# Patient Record
Sex: Female | Born: 1940 | Race: White | Hispanic: No | Smoking: Former smoker
Health system: Southern US, Community
[De-identification: ages and names within clinical notes are randomized; demographics above are authoritative.]

## PROBLEM LIST (undated history)

## (undated) DIAGNOSIS — M549 Dorsalgia, unspecified: Secondary | ICD-10-CM

## (undated) DIAGNOSIS — G8929 Other chronic pain: Secondary | ICD-10-CM

## (undated) DIAGNOSIS — R200 Anesthesia of skin: Secondary | ICD-10-CM

## (undated) DIAGNOSIS — F329 Major depressive disorder, single episode, unspecified: Secondary | ICD-10-CM

## (undated) DIAGNOSIS — I82409 Acute embolism and thrombosis of unspecified deep veins of unspecified lower extremity: Secondary | ICD-10-CM

## (undated) DIAGNOSIS — F32A Depression, unspecified: Secondary | ICD-10-CM

## (undated) DIAGNOSIS — B9681 Helicobacter pylori [H. pylori] as the cause of diseases classified elsewhere: Secondary | ICD-10-CM

## (undated) DIAGNOSIS — K2970 Gastritis, unspecified, without bleeding: Secondary | ICD-10-CM

## (undated) DIAGNOSIS — F419 Anxiety disorder, unspecified: Secondary | ICD-10-CM

## (undated) DIAGNOSIS — K219 Gastro-esophageal reflux disease without esophagitis: Secondary | ICD-10-CM

## (undated) DIAGNOSIS — K2270 Barrett's esophagus without dysplasia: Secondary | ICD-10-CM

## (undated) HISTORY — DX: Gastritis, unspecified, without bleeding: K29.70

## (undated) HISTORY — DX: Helicobacter pylori (H. pylori) as the cause of diseases classified elsewhere: B96.81

## (undated) HISTORY — DX: Dorsalgia, unspecified: M54.9

## (undated) HISTORY — DX: Depression, unspecified: F32.A

## (undated) HISTORY — DX: Acute embolism and thrombosis of unspecified deep veins of unspecified lower extremity: I82.409

## (undated) HISTORY — DX: Other chronic pain: G89.29

## (undated) HISTORY — DX: Barrett's esophagus without dysplasia: K22.70

## (undated) HISTORY — DX: Anxiety disorder, unspecified: F41.9

## (undated) HISTORY — DX: Anesthesia of skin: R20.0

## (undated) HISTORY — DX: Major depressive disorder, single episode, unspecified: F32.9

## (undated) HISTORY — PX: VAGINAL HYSTERECTOMY: SUR661

## (undated) HISTORY — DX: Gastro-esophageal reflux disease without esophagitis: K21.9

---

## 1975-01-17 HISTORY — PX: APPENDECTOMY: SHX54

## 1979-01-17 HISTORY — PX: ABDOMINAL HYSTERECTOMY: SHX81

## 1985-01-16 HISTORY — PX: CHOLECYSTECTOMY: SHX55

## 1995-01-17 HISTORY — PX: BACK SURGERY: SHX140

## 1997-11-24 HISTORY — PX: COLONOSCOPY: SHX174

## 2001-11-25 ENCOUNTER — Ambulatory Visit (HOSPITAL_COMMUNITY): Admission: RE | Admit: 2001-11-25 | Discharge: 2001-11-25 | Payer: Self-pay | Admitting: Internal Medicine

## 2001-11-25 HISTORY — PX: COLONOSCOPY: SHX174

## 2004-04-06 ENCOUNTER — Inpatient Hospital Stay (HOSPITAL_COMMUNITY): Admission: AD | Admit: 2004-04-06 | Discharge: 2004-04-08 | Payer: Self-pay | Admitting: Cardiology

## 2004-04-06 ENCOUNTER — Ambulatory Visit: Payer: Self-pay | Admitting: Cardiology

## 2004-04-22 ENCOUNTER — Ambulatory Visit: Payer: Self-pay | Admitting: Cardiology

## 2006-01-16 DIAGNOSIS — B9681 Helicobacter pylori [H. pylori] as the cause of diseases classified elsewhere: Secondary | ICD-10-CM

## 2006-01-16 HISTORY — DX: Helicobacter pylori (H. pylori) as the cause of diseases classified elsewhere: B96.81

## 2006-06-07 ENCOUNTER — Ambulatory Visit: Payer: Self-pay | Admitting: Internal Medicine

## 2006-06-21 ENCOUNTER — Encounter: Payer: Self-pay | Admitting: Internal Medicine

## 2006-06-21 ENCOUNTER — Ambulatory Visit (HOSPITAL_COMMUNITY): Admission: RE | Admit: 2006-06-21 | Discharge: 2006-06-21 | Payer: Self-pay | Admitting: Internal Medicine

## 2006-06-21 ENCOUNTER — Ambulatory Visit: Payer: Self-pay | Admitting: Internal Medicine

## 2006-06-21 HISTORY — PX: ESOPHAGOGASTRODUODENOSCOPY: SHX1529

## 2006-06-21 HISTORY — PX: COLONOSCOPY: SHX174

## 2006-06-26 ENCOUNTER — Ambulatory Visit (HOSPITAL_COMMUNITY): Admission: RE | Admit: 2006-06-26 | Discharge: 2006-06-26 | Payer: Self-pay | Admitting: Internal Medicine

## 2006-08-27 ENCOUNTER — Ambulatory Visit: Payer: Self-pay | Admitting: Urgent Care

## 2007-03-13 ENCOUNTER — Ambulatory Visit: Payer: Self-pay | Admitting: Internal Medicine

## 2007-07-08 ENCOUNTER — Ambulatory Visit: Payer: Self-pay | Admitting: Internal Medicine

## 2007-07-08 ENCOUNTER — Encounter: Payer: Self-pay | Admitting: Internal Medicine

## 2007-07-08 ENCOUNTER — Ambulatory Visit (HOSPITAL_COMMUNITY): Admission: RE | Admit: 2007-07-08 | Discharge: 2007-07-08 | Payer: Self-pay | Admitting: Internal Medicine

## 2007-07-08 HISTORY — PX: ESOPHAGOGASTRODUODENOSCOPY: SHX1529

## 2007-12-09 ENCOUNTER — Encounter: Payer: Self-pay | Admitting: Internal Medicine

## 2007-12-09 DIAGNOSIS — J438 Other emphysema: Secondary | ICD-10-CM | POA: Insufficient documentation

## 2007-12-09 DIAGNOSIS — Z8719 Personal history of other diseases of the digestive system: Secondary | ICD-10-CM

## 2009-06-01 ENCOUNTER — Telehealth (INDEPENDENT_AMBULATORY_CARE_PROVIDER_SITE_OTHER): Payer: Self-pay

## 2009-06-22 ENCOUNTER — Ambulatory Visit: Payer: Self-pay | Admitting: Internal Medicine

## 2009-06-22 DIAGNOSIS — R1319 Other dysphagia: Secondary | ICD-10-CM

## 2009-06-22 DIAGNOSIS — M549 Dorsalgia, unspecified: Secondary | ICD-10-CM | POA: Insufficient documentation

## 2009-06-22 DIAGNOSIS — R109 Unspecified abdominal pain: Secondary | ICD-10-CM | POA: Insufficient documentation

## 2009-07-28 ENCOUNTER — Ambulatory Visit: Payer: Self-pay | Admitting: Internal Medicine

## 2009-07-28 ENCOUNTER — Ambulatory Visit (HOSPITAL_COMMUNITY): Admission: RE | Admit: 2009-07-28 | Discharge: 2009-07-28 | Payer: Self-pay | Admitting: Internal Medicine

## 2009-07-28 HISTORY — PX: ESOPHAGOGASTRODUODENOSCOPY: SHX1529

## 2009-08-01 ENCOUNTER — Encounter: Payer: Self-pay | Admitting: Internal Medicine

## 2009-10-12 ENCOUNTER — Ambulatory Visit: Payer: Self-pay | Admitting: Cardiology

## 2010-02-15 NOTE — Letter (Signed)
Summary: EGD/ED ORDER  EGD/ED ORDER   Imported By: Ave Filter 06/22/2009 16:45:56  _____________________________________________________________________  External Attachment:    Type:   Image     Comment:   External Document

## 2010-02-15 NOTE — Letter (Signed)
Summary: Patient Notice, Endo Biopsy Results  [facility]  [address]   [place], [place] [address]   Phone: [phone]  Fax: [phone]       August 01, 2009   [facility] [address] [place], [place]  [address] 57/84/6962    Dear Ms. Werts,  I am pleased to inform you that the biopsies taken during your recent endoscopic examination did not show any evidence of cancer upon pathologic examination.  Additional information/recommendation   Continue with the treatment plan as outlined on the day of your exam  Please call us if you are having persistent problems or have questions about your condition that have not been fully answered at this time.  Sincerely,    R. Roetta Sessions MD, FACP [facility] Ph: [phone]   Fax: [phone]   Appended Document: Patient Notice, Endo Biopsy Results letter mailed to pt

## 2010-02-15 NOTE — Progress Notes (Signed)
----   Converted from flag ---- ---- 06/01/2009 10:09 AM, Diana Eves wrote: pt aware of appt on 6/7 @1130  w/KJ  ---- 05/27/2009 1:14 PM, Cloria Spring LPN wrote: Pt wanted to schedule appt for TCS but is having some intermittent abdominal pain. She says she is not having any nausea or vomiting and is not having any problems with BM's. I told her she would need appt first. ------------------------------

## 2010-02-15 NOTE — Assessment & Plan Note (Signed)
Summary: consult for tcs,abd pain/ss   Primary Care Provider:  Hasanaj  Chief Complaint:  abd pain/consult for TCS/dysphagia.  History of Present Illness: 70 y/o caucasian female w/ worsening mid-abd pain x 3 weeks. Hx chronic abd pain and chronic back pain, describes as "sharp," doubles her over, lasts 15 mins - half-day.  No aggrevating factors, not assoc w/ eating. Eases if she self-induces vomiting.  Done this 3-4 times in past 3 weeks as this is only thing that helps.  Denies fever/chills.  Denies NSAIDs.  Takes percocet for back pain 3x/week.  Takes omeprazole daily.  Wt small gains.  Denies OTC stomach meds.  BM daily w/ 2-3 loose watery stools/ week.  Denies rectal bleeding or melena.  c/o pill dysphagia.  Denies any problems w/ liquids.  Last EGD 07/08/2007 NORMAL bx to r/o changes suspicious for Barrett's.  Hx chronically dialted CDB 12mm with normal LFTs.    Current Problems (verified): 1)  Other Dysphagia  (ICD-787.29) 2)  Abdominal Pain, Chronic  (ICD-789.00) 3)  Back Pain, Chronic  (ICD-724.5) 4)  Barrett's Esophagus, Hx of  (ICD-V12.79) 5)  Helicobacter Pylori Gastritis, Hx of  (ICD-V12.79) 6)  Emphysema  (ICD-492.8)  Current Medications (verified): 1)  Alprazolam 1 Mg Tabs (Alprazolam) .... Take 1 Tab By Mouth At Bedtime 2)  Percocet 5-325 Mg Tabs (Oxycodone-Acetaminophen) .... One To Two Tablets Each Day As Needed 3)  Combivent 103-18 Mcg/act Aero (Ipratropium-Albuterol) .... As Needed 4)  Omeprazole 20 Mg Cpdr (Omeprazole) .... Take 1 Tablet By Mouth Once A Day  Allergies (verified): 1)  ! Lucy Antigua  Past History:  Past Medical History: Last EGD 07/08/2007 NORMAL bx, previous hx SS Barrett's Chronically dialted CDB 12mm with normal LFTs s/p cholecystectomy  Hx TA and mild colitis->flex sigmoidoscopy 1999 chronic lung disease w/ hx tobacco abuse chronic back pain chronic functional abd pain Last Colonoscopy unable to intubate cecum, otherwise normal, followed by normal  ACBE  Past Surgical History: Back Surgery 1999 Cholecystectomy 1987 Hysterectomy 1981 Appendectomy 1977  Family History: Mother- leukemia Father-chronic lung disease No known family history of colorectal carcinoma, IBD, liver or chronic GI problems.  Social History: single, retired nonsmoker Alcohol Use - no Illicit Drug Use - no Drug Use:  no  Review of Systems General:  Complains of fatigue; denies fever, chills, sweats, anorexia, weakness, malaise, weight loss, and sleep disorder. CV:  Denies chest pains, angina, palpitations, syncope, dyspnea on exertion, orthopnea, PND, peripheral edema, and claudication. Resp:  Denies dyspnea at rest, dyspnea with exercise, cough, sputum, wheezing, coughing up blood, and pleurisy. GI:  See HPI; Denies jaundice. GU:  Denies urinary burning, blood in urine, nocturnal urination, urinary frequency, and urinary incontinence. Derm:  Denies rash, itching, dry skin, hives, moles, warts, and unhealing ulcers; bruising easily. Psych:  Denies depression, anxiety, memory loss, suicidal ideation, hallucinations, paranoia, phobia, and confusion. Heme:  Complains of bruising; denies bleeding and enlarged lymph nodes.  Vital Signs:  Patient profile:   70 year old female Height:      72 inches Weight:      190 pounds BMI:     25.86 Temp:     98.2 degrees F oral Pulse rate:   72 / minute BP sitting:   140 / 70  (left arm) Cuff size:   regular  Vitals Entered By: Cloria Spring LPN (June 23, 5282 11:30 AM)  Physical Exam  General:  Well developed, well nourished, no acute distress. Head:  Normocephalic and atraumatic. Eyes:  Sclera clear, no icterus. Ears:  Normal auditory acuity. Nose:  No deformity, discharge,  or lesions. Mouth:  No deformity or lesions, dentition normal. Neck:  Supple; no masses or thyromegaly. Lungs:  Clear throughout to auscultation. Heart:  Regular rate and rhythm; no murmurs, rubs,  or bruits. Abdomen:  Soft, nontender  and nondistended. No masses, hepatosplenomegaly or hernias noted. Normal bowel sounds.without guarding and without rebound.   Msk:  Symmetrical with no gross deformities. Normal posture. Pulses:  Normal pulses noted. Extremities:  No clubbing, cyanosis, edema or deformities noted. Neurologic:  Alert and  oriented x4;  grossly normal neurologically. Skin:  Intact without significant lesions or rashes. Cervical Nodes:  No significant cervical adenopathy. Psych:  Alert and cooperative. Normal mood and affect.  Impression & Recommendations:  Problem # 1:  OTHER DYSPHAGIA (ICD-787.29) 70 y/o caucasian female w/ hx chronic functional abd pain w/ intermittant occ loose stools/IBS, hx tubular adenoma, hx h pylori s/p treatment & Barrett's esophagus.  Now presents w/ pill dysphagia.  She will need further evaluation to r/o esophageal web, ring or stricture.  She is concerned she will need repeat colonscopy given her chronic abd pain.  I discussed this w/ Dr Jena Gauss who performed her last colonscopy & BE.  He recommends 5 yr interval for surveillance.  EGD with possible esophageal dilatation to be performed by Dr. Jonathon Bellows in the near future.  I have discussed risks and benefits which include, but are not limited to, bleeding, infection, perforation, or medication reaction.  The patient agrees with this plan and consent will be obtained.  Problem # 2:  BARRETT'S ESOPHAGUS, HX OF (ICD-V12.79) See #1  Problem # 3:  ABDOMINAL PAIN, CHRONIC (ICD-789.00) Long-standing ? adhesions, functional abd pain, IBS.  Patient Instructions: 1)  Begin Align daily 2)  Trial Levsin 0.125mg  two times a day as needed abd pain 3)  Continue omeprazole  4)  Colonoscopy 06/2011 Prescriptions: ALIGN  CAPS (PROBIOTIC PRODUCT) 1 by mouth daily  #5 x 0   Entered and Authorized by:   Joselyn Arrow FNP-BC   Signed by:   Joselyn Arrow FNP-BC on 06/22/2009   Method used:   Samples Given   RxID:    [id number] LEVSIN 0.125 MG TABS (HYOSCYAMINE SULFATE) 1 by mouth two times a day as needed for abd pain  #60 x 1   Entered and Authorized by:   Joselyn Arrow FNP-BC   Signed by:   Joselyn Arrow FNP-BC on 06/22/2009   Method used:   Electronically to        [facility]* (retail)       [address]       [place], [place]  [address]       Ph: [phone]       Fax: [phone]   RxID:   [id number]   Appended Document: Orders Update    Clinical Lists Changes  Orders: Added new Service order of Est. Patient Level IV (64403) - Signed      Appended Document: consult for tcs,abd pain/ss REMINDER IN COMPUTER

## 2010-05-31 NOTE — Op Note (Signed)
Jessica Gibbs, Jessica Gibbs                ACCOUNT NO.:  [id number]   MEDICAL RECORD NO.:  [id number]          PATIENT TYPE:  AMB   LOCATION:  DAY                           FACILITY:  [facility]   PHYSICIAN:  R. Roetta Sessions, M.D. DATE OF BIRTH:  06-15-1940   DATE OF PROCEDURE:  06/21/2006  DATE OF DISCHARGE:                               OPERATIVE REPORT   PROCEDURE PERFORMED:  Esophagogastroduodenoscopy with biopsy followed by  colonoscopy.   INDICATIONS FOR PROCEDURE:  70 year old lady with nonspecific abdominal  pain, history of colonic polyps, overdue for surveillance.  EGD and  colonoscopy now being done through our office.  CBC looked good.  Eosinophil count was slightly elevated at 11%.  LFTs were normal.  Amylase 52, lipase 34.  She has not been having any diarrhea or  hematochezia.  This approach has been discussed with the patient at  length.  The potential risks, benefits, and alternatives have been  reviewed, questions answered, and she is agreeable.  Please see  documentation in the medical record.   PROCEDURE NOTE:  O2 saturation, blood pressure, and pulse rate were  monitored throughout the entirety of both procedures.  Conscious  sedation with Versed 9 mg IV and Demerol 175 mg IV in divided doses.  Cetacaine spray for topical pharyngeal anesthesia.  Instrument Pentax  video chip system.   ESOPHAGOGASTRODUODENOSCOPY FINDINGS:  Examination of the tubular  esophagus revealed a 3 cm tongue of salmon colored epithelium coming up  from the EG junction.  The remainder of the esophageal mucosa appeared  normal.  The EG junction was easily traversed into the stomach.  The  gastric cavity was empty and insufflated well with air.  Thorough  examination of the gastric mucosa including a retroflex view of the  proximal stomach and esophagogastric junction demonstrated only a hiatal  hernia and diffusely reticulate pattern to the gastric mucosa.  No  infiltrating process, erosion, or  ulceration was seen.  The pylorus was  patent and easily traversed.  Examination of the bulb and second portion  revealed no abnormalities.   THERAPY/DIAGNOSTIC MANEUVERS PERFORMED:  Gastric biopsies were taken  and, also, the salmon colored epithelium in the distal esophagus was  also biopsied separately.  The patient tolerated the procedure well and  was prepared for colonoscopy.   COLONOSCOPY FINDINGS:  Digital rectal examination revealed no  abnormalities.  The endoscopic prep was marginal.  The colonic mucosa  was surveyed from the rectosigmoid colon through the left, transverse,  right colon, to the area of the ileocecal valve.  I initially was unable  to advance the adult scope beyond the area of the transverse colon.  The  left colon was somewhat fixed and noncompliant, likely secondary to  adhesions from prior hysterectomy.  I withdrew the adult scope and using  the pediatric colonoscope, was able to make it to the distal side of the  ileocecal valve but, in spite of exhausting all maneuvers including  changes in the patient's position and external abdominal pressure, I was  unable to intubate the cecum.  From this level, the scope was  slowly  withdrawn.  All previously mentioned mucosal surfaces were again seen.  The colon, otherwise, appeared normal.  The scope was pulled down in the  rectum.  A thorough examination of the rectal mucosa including  retroflexion of the anal verge demonstrated normal rectal mucosa.   The patient tolerated both procedures well, was reacted in endoscopy.   IMPRESSION:  Esophagogastroduodenoscopy:  3 cm tongue of salmon colored  epithelium distal esophagus, biopsied.  Small hiatal hernia.  Reticulating appearing gastric mucosa, biopsied.  Patent pylorus.  Normal D1 and D2.  Colonoscopy findings:  Normal rectum.  Normal appearing colon to the  ileocecal valve.  Cecum not intubated.   RECOMMENDATIONS:  1. Contrast barium enema to image the cecum  not seen today.  Repeat      CBC with differential, abdominal ultrasound, further evaluate      abdominal pain, follow up on path.  2. Further recommendations to follow.      Jonathon Bellows, M.D.  Electronically Signed     RMR/MEDQ  D:  06/21/2006  T:  06/21/2006  Job:  [id number]

## 2010-05-31 NOTE — H&P (Signed)
Jessica Gibbs, Jessica Gibbs                ACCOUNT NO.:  [id number]   MEDICAL RECORD NO.:  [id number]          PATIENT TYPE:  AMB   LOCATION:                                FACILITY:  [facility]   PHYSICIAN:  R. Roetta Sessions, M.D. DATE OF BIRTH:  July 20, 1940   DATE OF ADMISSION:  DATE OF DISCHARGE:                              HISTORY & PHYSICAL   PRIMARY CARE PHYSICIAN:  Dr. __________   CHIEF COMPLAINT:  Abdominal pain.   HISTORY OF PRESENT ILLNESS:  Ms. Jessica Gibbs is a very pleasant 70-year-  old lady with a few-month history of periumbilical abdominal pain.  She  states it is worse at night and sometimes doubles her over.  She has  one bowel movement daily to every other day.  No melena.  No rectal  bleeding.  Bowel function does not alter the pain.  She wakes up with it  and goes to bed with it.  Sometimes it is worse, as stated, at night.  She does have some typical reflux symptoms for which she takes Prevacid  30 mg orally daily.  She does not take nonsteroidals.  There has been no  odynophagia, no dysphagia, no early satiety, no nausea or vomiting, no  weight loss.  She does not consume alcohol or use tobacco products.  Her  gallbladder is out (1987).  She had similar symptoms in 1999, for which  Dr. Linna Darner __________  did an EGD and apparently found nothing  significant.  She has a history of colonic adenomas.  Her last  colonoscopy was in 2003.  She had a good report at that time, done by  me.  She is due for surveillance now.  She has not had any imaging  studies, blood work, Catering manager., recently.   PAST MEDICAL HISTORY:  1. Significant for chronic lung disease.  2. She is a former smoker.  3. Chronic back pain.  4. Status post cholecystectomy in 1987, back surgery in 1999,      hysterectomy in 1981, appendectomy in 1977.   CURRENT MEDICATIONS:  1. Alprazolam 1 mg q.h.s.  2. Percocet 5/325 1-2 daily.  3. Combivent inhaler p.r.n.  4. Prevacid 30 mg daily p.r.n.   ALLERGIES:  No  known drug allergies.   FAMILY HISTORY:  Mother had leukemia.  Father had chronic lung disease.  No history of chronic GI or liver illness.   SOCIAL HISTORY:  Patient is single.  She is unemployed.  No tobacco.  No  alcohol.  No illicit drugs.   REVIEW OF SYSTEMS:  No jaundice, fever, chills, clay-colored stools,  dark-colored urine.  No change in weight.   PHYSICAL EXAMINATION:  GENERAL:  Pleasant 70 year old lady resting  comfortably.  VITAL SIGNS:  Weight 188, height 6 feet.  Temp 98.4, blood pressure  138/70, pulse 64.  SKIN:  Warm and dry.  HEENT:  No scleral icterus.  Conjunctivae are pink.  Oral cavity with no  lesions.  CHEST:  Lungs are clear to auscultation.  CARDIAC:  Regular rate and rhythm without murmur, gallop or rub.  BREASTS:  Exam is  deferred.  ABDOMEN:  Nondistended.  Positive bowel sounds.  Soft.  Entirely  nontender.  Without appreciate mass, organomegaly.  EXTREMITIES:  No edema.  RECTAL:  Deferred to the colonoscopy.   Ms. Jessica Gibbs is a pleasant 70 year old lady with recent abdominal  pain as described above.  Symptoms somewhat nonspecific.  I suppose she  could have peptic ulcer disease.  Clinically she has an entirely benign  exam today.  Other possibilities such as pancreatitis, occult biliary  disease would remain in the differential.  Her symptoms are not tied to  bowel function.  She has a history of colonic adenoma and is due for a  surveillance exam now.   RECOMMENDATIONS:  Will proceed with an EGD and a colonoscopy in the near  future.  Potential risks, benefits and alternatives have been reviewed.  Questions are answered.  She is agreeable.  Will go ahead and check  amylase and lipase, chem-20 and CBC today as well.  Will make further  recommendations in the very near future.      Jonathon Bellows, M.D.  Electronically Signed     RMR/MEDQ  D:  06/07/2006  T:  06/07/2006  Job:  [id number]

## 2010-05-31 NOTE — Op Note (Signed)
[place], Jessica Gibbs                ACCOUNT NO.:  [id number]   MEDICAL RECORD NO.:  [id number]          PATIENT TYPE:  AMB   LOCATION:  DAY                           FACILITY:  [facility]   PHYSICIAN:  R. Roetta Sessions, M.D. DATE OF BIRTH:  30-Dec-1940   DATE OF PROCEDURE:  DATE OF DISCHARGE:                               OPERATIVE REPORT   PROCEDURE PERFORMED:  Esophagogastroduodenoscopy with biopsy.   INDICATIONS FOR PROCEDURE:  A 70 year old lady with longstanding  gastroesophageal reflux disease, esophagogastroduodenoscopy last year  demonstrated 3-cm tongue of salmon-colored epithelium, highly suspicious  for Barrett's, but biopsies were not consistent with Barrett's, and  certainly no dysplasia or evidence of malignancy.  She was treated for  H. pylori gastritis last year.  She has done well.  She is really having  no GI symptoms on Prevacid 30 mg orally daily.  She is brought back now  to reassess her EG junction, to determine whether or not she has  Barrett's esophagus.  This approach has been discussed with the patient  at length.  Potential risks, benefits, and alternatives have been  reviewed and questions answered.  She is agreeable.  Please see  documentation in the medical record.   PROCEDURE NOTE:  O2 saturation, blood pressure, pulse, and respirations  monitored throughout the entire procedure.   CONSCIOUS SEDATION:  Versed 4 mg IV, Demerol 125 mg IV in divided doses.  Cetacaine spray for topical pharyngeal anesthesia.   INSTRUMENT:  Pentax video chip system.   FINDINGS:  Examination of the tubular esophagus revealed a 3-cm tongue  of salmon-colored epithelium coming out from the EG junction.  Please  see photos.  Otherwise, esophageal mucosa appeared unremarkable.  The EG  junction was easily traversed.   Stomach:  Gastric cavity was emptied and insufflated well with air.  Thorough examination of the gastric mucosa including retroflexed view of  the proximal stomach  esophagogastric junction demonstrated some  reticulated pattern to the mucosa as seen 1 year ago, and there were  some tiny submucosal petechia.  There were some infiltrating process.  No ulcer.  EG junction was easily traversed.  Examination of the bulb  and second portion revealed no abnormalities.   THERAPEUTIC/DIAGNOSTIC MANEUVERS PERFORMED:  1. A 3-cm tongue of salmon-colored epithelium was biopsied for      histologic study.  2. Biopsies of the gastric mucosa were taken to rule out eradication      of H. pylori, etc.  The patient tolerated the procedure well and      was reacted to endoscopy.   IMPRESSION:  A 3 cm tongue of salmon-colored epithelium, suspicious for  Barrett's status post biopsy, otherwise unremarkable esophageus.  Reticulated pattern of the gastric mucosa in a patchy distribution with  tiny submucosal petechia, of uncertain significance, status post biopsy.  Patent pylorus, normal D1 and D2.   RECOMMENDATIONS:  1. Continue Prevacid 30 mg daily.  2. Follow up on biopsies.  3. Further recommendations to follow up.      Jonathon Bellows, M.D.  Electronically Signed     RMR/MEDQ  D:  07/08/2007  T:  07/08/2007  Job:  [id number]

## 2010-05-31 NOTE — Assessment & Plan Note (Signed)
NAMEBURNETT, LIEBER                 CHART#:  [id number]   DATE:  03/13/2007                       DOB:  August 17, 1940   FOLLOWUP:  H. Pyloric gastritis, short segment Barrett's esophagus (by  screening).   LAST SEEN:  08/27/2006.   She had taken Prevpac.  She has done well.  She developed some what  sounds like self limiting thrush with the antibiotics previously.  She  had an incomplete colonoscopy, unable to see the cecum last year, but it  was complemented with an air contrast barium enema, which demonstrated  no cecal abnormalities.   She is status post cholecystectomy, CBD chronically dilated at 12 mm.  LFTs were normal.  Overall she has done very well.  Reflux symptoms well  controlled on Prevacid 30 mg once daily.  Overall she feels well from a  GI standpoint.  She continues to be followed by Dr. Olena Leatherwood primarily.   CURRENT MEDICATIONS:  See updated list.   ALLERGIES:  No known drug allergies.   EXAMINATION:  Today she looks well.  Weight 190.  Height 6 feet.  Temperature 98.  Blood pressure 112/70, pulse 60.  CHEST:  Lungs are clear to auscultation.  HEART:  Regular rate and rhythm without murmur, gallop, rub.  ABDOMEN:  Nondistended.  Positive bowel sounds.  Soft, nontender without  appreciable mass or hepatosplenomegaly.   ASSESSMENT:  Negative examination of her colon last year by TCS and ACD,  even with a history of colonic polyps.  Recommend she come back in five  years for followup colonoscopy.  As far as short segment Barrett's,  although it was stated in the prior record, a three year followup EGD,  she really ought to go ahead and have one, 1 year out, and therefore we  will set  her up for an EGD in June of this year.  She is to continue Prevacid 30  mg early daily indefinitely.       Jonathon Bellows, M.D.  Electronically Signed     RMR/MEDQ  D:  03/13/2007  T:  03/13/2007  Job:  [id number]   cc:   Lia Hopping

## 2010-05-31 NOTE — Assessment & Plan Note (Signed)
NAMELOZA, PRELL                 CHART#:  [id number]   DATE:  08/27/2006                       DOB:  1940/05/30   CHIEF COMPLAINT:  Follow-up colonoscopy and EGD.   SUBJECTIVE:  Jessica Gibbs is a 70 year old female who underwent EGD and  colonoscopy by Dr. Jena Gauss on 06/21/2006.  She has a history of  nonspecific abdominal pain as well as colonic polyps.  She also had  eosinophilia on peripheral blood work.  Her LFTs were normal and she had  a normal amylase and lipase.  On EGD she was found to have a 3-cm tongue  of salmon-colored epithelium.  The biopsy is consistent with Barrett's  esophagus.  She had a small hiatal hernia and otherwise normal exam.  Her colonoscopy was normal; however, Dr. Jena Gauss was unable to intubate  the cecum.  This was followed by a contrast barium enema which was  normal.  Gastric biopsies revealed chronic gastritis with focal  intestinal metaplasia and H. pylori.  She tells me she has not been  treated as of this point.  Her ultrasound also showed a dilated biliary  tree with central intrahepatic biliary radicals and a CBD of 12 mm.  LFTs were normal.  She has been feeling well since her procedure.  She  does complain of some fatigue.  Last week she was seen by Dr. Olena Leatherwood,  treated for pharyngitis with antibiotic for 5 days.  She denies any  abdominal pain at this time.  Denies any fever, chills, nausea,  vomiting, heartburn, indigestion.  She has about 3 loose bowel movements  a day.  Denies any rectal bleeding, melena or mucus in her stools.   CURRENT MEDICATIONS:  See the list from 08/27/2006.   ALLERGIES:  No drug allergies.   OBJECTIVE:  VITAL SIGNS:  Weight 187.5 pounds, height 5 feet 2 inches,  temperature 98.1, blood pressure 128/70, pulse 60.  GENERAL:  Ms. Jessica Gibbs is a well-developed, well-nourished Caucasian female  in no acute distress.  HEENT:  Equal, sclerae clear, nonicteric.  Conjunctivae pink, oropharynx  pink and moist without any  lesions.  CHEST:  Heart regular rate and rhythm.  Normal S1, S2.  ABDOMEN:  Positive bowel sounds x4.  No bruits auscultated.  Soft,  nontender, nondistended.  No palpable masses or hepatosplenomegaly.  No  rebound tenderness or guarding.  EXTREMITIES:  Without clubbing or edema bilaterally.  SKIN:  Pink, warm and dry without any rashes or jaundice.   ASSESSMENT:  Ms. Jessica Gibbs is a 70 year old female with vague upper  abdominal pain which is now resolved on Prevacid 30 mg in the morning.  She was found to have Barrett's esophagus.  She also has a hiatal hernia  and was found to have H. pylori gastritis.  Personal history of colonic  adenomas.   PLAN:  1. We will begin Prevpac as soon as she completes antibiotic for      pharyngitis.  2. I have asked her to have buttermilk or yogurt with each meal.  3. Colonoscopy in 5 years given history of adenomatous polyp.  4. EGD in 3 years to follow up on Barrett's.  5. Office visit in 6 months with Dr. Jena Gauss.       Jessica Gibbs, N.P.  Electronically Signed     Kassie Mends, M.D.  Electronically Signed  KJ/MEDQ  D:  08/27/2006  T:  08/28/2006  Job:  [id number]   cc:   Lia Hopping

## 2010-06-03 NOTE — Op Note (Signed)
NAMESTELLAROSE, Jessica Gibbs                            ACCOUNT NO.:  [id number]   MEDICAL RECORD NO.:  [id number]                   PATIENT TYPE:  AMB   LOCATION:  DAY                                  FACILITY:  [facility]   PHYSICIAN:  R. Roetta Sessions, M.D.              DATE OF BIRTH:  October 10, 1940   DATE OF PROCEDURE:  11/25/2001  DATE OF DISCHARGE:                                 OPERATIVE REPORT   INDICATIONS FOR PROCEDURE:  The patient is a 70 year old lady devoid of any  lower GI tract symptoms, had an adenomatous polyp removed from her colon in  1999.  It was from the left colon.  She continues to be devoid of any lower  GI tract symptoms.  No abdominal pain, diarrhea, constipation, or blood per  rectum.  She is here for surveillance.  She has not had any interim health  problems.  She is now undergoing surveillance colonoscopy ASA2.   PROCEDURE NOTE:  Her O2 saturation, blood pressure, pulse oximetry were  monitored throughout the entire procedure.  Conscious sedation of Versed 5  mg IV, Demerol 100 mg IV in divided doses.  The instrument was an Olympus  Video _____ adult colonoscope.   FINDINGS:  Digital rectal exam revealed no abnormalities.  Endoscopic  findings showed the prep was adequate.  Rectal and colon examination:  Rectal mucosa in the retroflexed view virtually revealed only internal  hemorrhoids.   Colonic mucosa was surveyed from the rectosigmoid junction through the left  transverse, right colon, to the appendiceal orifice, ileocecal valve, to the  cecum.  These sections were well seen and photographed for the record.   Colonic mucosa appeared normal aside from there was some subjective  increased hyperemia diffusely of the left colon, and at least subjectively a  slight narrowing of the lumen through the sigmoid segment, but this was  minimal, and there were no erosions or ulcerations.  Vascular pattern was  preserved.  No evidence of recurrent polyp or neoplasm.  The  cecum and  ileocecal valve were well seen and photographed for the record.  From this  level the scope was slowly and cautiously withdrawn.  All previously  mentioned mucosal surfaces were again seen.  No other abnormalities were  observed.  The patient tolerated the procedure well and was brought to  recovery.   IMPRESSION:  1. Internal hemorrhoids, otherwise normal rectum.  2. Normal-appearing colonic mucosa except subjectively some increased     hyperemia and minimal narrowing of the sigmoid colon compared to the more     proximal colon, but this was minimal and of doubt for clinical     significance.  The patient has no bowel symptoms.   RECOMMENDATIONS:  1. Yearly Hemoccults per family physician, Dr. ________.  2. Repeat surveillance colonoscopy in five years.  Should the patient     develop any interim GI problems, she is  to let me know.                                               Jonathon Bellows, M.D.   RMR/MEDQ  D:  11/25/2001  T:  11/25/2001  Job:  [id number]

## 2010-06-03 NOTE — Cardiovascular Report (Signed)
NAMEWALTA, BELLVILLE                ACCOUNT NO.:  [id number]   MEDICAL RECORD NO.:  [id number]          PATIENT TYPE:  INP   LOCATION:  4731                         FACILITY:  [facility]   PHYSICIAN:  Arturo Morton. Riley Kill, M.D. [facility] OF BIRTH:  1940/12/20   DATE OF PROCEDURE:  04/07/2004  DATE OF DISCHARGE:                              CARDIAC CATHETERIZATION   INDICATIONS:  Ms. Schubach is a 70 year old woman who presents with some  atypical chest pain with negative enzymes. She has COPD as well as a history  of hiatal hernia and has bifemoral bruits suggestive of peripheral vascular  disease. Current study was done to assess coronary anatomy.   PROCEDURE:  1.  Left heart catheterization.  2.  Selective coronary territory.  3.  Selective left ventriculography.   DESCRIPTION OF PROCEDURE:  The patient was brought to the catheterization  laboratory, prepped and draped in usual fashion. Through an anterior  puncture, the right femoral artery was entered using a Smart needle and a 6-  [place] sheath was placed. Views of left and right coronaries were obtained  in multiple angiographic projections. There was no damping of any of the  catheters and she tolerated the procedure well. There were no complications.  She was taken to the holding area in satisfactory clinical condition.   HEMODYNAMIC DATA:  1.  Central aortic pressure 144/70, mean 100.  2.  Left ventricular pressure 124/14.  3.  No gradient pullback across aortic valve.   ANGIOGRAPHIC DATA:  1.  Ventriculography was performed in the RAO projection. There was ectopy      resulting in the post PVC beats. Ejection fraction was calculated at      86%. No wall motion abnormalities were seen. There does not appear to be      significant mitral regurgitation. Aortic leaflets open well.  2.  The left main coronary was free of critical disease.  3.  Left anterior descending artery coursed to the apex. In the RAO cranial      view, there was  some mild eccentric plaquing that involved the origin of      the diagonal branch. The diagonal itself was fairly large. The diagonal      had about 40% narrowing but this did not appear to be flow-limiting. The      remainder of the LAD was without critical narrowing.  4.  The circumflex provided two marginal branches and then terminated as an      AV circumflex on the third marginal. Other than minor luminal      irregularity. No significant focal areas of stenosis were noted.  5.  The proximal right coronary artery demonstrates minimal hypodensity, but      no evidence of significant high-grade focal obstruction. There is minor      luminal irregularity in the mid right and posterior descending and      posterolateral branch without significant disease.   CONCLUSION:  1.  Well-preserved left ventricular function.  2.  Mild eccentric narrowing of the diagonal branch without flow limitation.  3.  Other findings as noted  above.   RECOMMENDATIONS:  A D-dimer will be planned. The patient will be discharged  in the morning with follow-up with Dr. Dr. Vernie Shanks. DeGent and Dr. Georgann Housekeeper. Risk factor reduction would be recommended. this is Arturo Morton.  Stuckey and that is the end of  the dictation thank you      TDS/MEDQ  D:  04/07/2004  T:  04/07/2004  Job:  [id number]   cc:   Learta Codding, M.D. [facility]   CV Laboratory   Patient's medical records   Georgann Housekeeper, MD  [address]  [place]  [place] [address]  Fax: [phone]

## 2010-06-03 NOTE — Discharge Summary (Signed)
NAMEAVIV, ROTA                ACCOUNT NO.:  [id number]   MEDICAL RECORD NO.:  [id number]          PATIENT TYPE:  INP   LOCATION:  4735                         FACILITY:  [facility]   PHYSICIAN:  Learta Codding, M.D. LHCDATE OF BIRTH:  Aug 08, 1940   DATE OF ADMISSION:  04/06/2004  DATE OF DISCHARGE:  04/08/2004                                 DISCHARGE SUMMARY   DISCHARGE DIAGNOSES:  1.  Chest pain with negative cardiac enzymes x3, status post cardiac      catheterization on April 07, 2004 by Dr. Shawnie Pons showing well      preserved left ventricular function with mild eccentric narrowing of the      diagonal branch without flow limitation.  2.  Mild hypercholesterolemia.   PAST MEDICAL HISTORY:  1.  Includes chronic obstructive pulmonary disease with emphysema and      asthma.  2.  History of hiatal hernia.  3.  History of gastroesophageal reflux disease.  4.  Chronic back pain.  5.  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   PROCEDURE:  1.  Cardiac catheterization on April 07, 2004. Results as stated above.   DISPOSITION:  The patient is being discharged to home after being seen by  Dr. Andee Lineman on April 08, 2004.   DISCHARGE MEDICATIONS:  I have instructed her to continue her aspirin 325 mg  daily. She is also being placed on a new medication, Zocor 20 mg daily. Will  need followup liver function studies and lipids in 4 to 6 weeks. I have  instructed her to continue her previous medications including her Zantac,  albuterol nebulizers, Mucinex, Advair. She uses Lortab, Darvocet, and Xanax  p.r.n. Pain management, Tylenol or general discomfort.   ACTIVITY:  No driving x2 days. No lifting over 10 pounds x1 week.   DIET:  She is to follow a low-fat, low-salt, low-cholesterol diet.   WOUND CARE:  Gently clean catheterization site with soap and water. No tub  bathing x1 week.   SPECIAL INSTRUCTIONS:  Continue tobacco  cessation.   FOLLOW UP:  Appointment with [facility] on Friday, April 22, 2004 at 1:30. As noted above, she will need blood work in 4 to 6 weeks.   HISTORY OF PRESENT ILLNESS:  This is a 70 year old Caucasian female who  originally presented to [facility] with complaints of chest  pain, shortness of breath. Blood work at [facility]:  Troponin negative x3.  BUN 20, creatinine 1.1, potassium 3.5, hemoglobin 13, hematocrit 40. The  patient was admitted for atypical chest pain. She was seen by Dr. Andee Lineman,  who recommended that the patient be transferred to [facility] for  further cardiac workup. The patient to cardiac catheterization lab on April 07, 2004. Tolerated the procedure without complications. She is being  discharged home today with followup as stated above. Currently, she is  afebrile. Blood pressure 120/65. Heart rate is 66 and regular. Fasting lipid  panel showed triglycerides are 125, total cholesterol of 191, HDL of 59 and  LDL of 107. Hemoglobin 12.9 and hematocrit 36.7. Platelet count 192,000.  Potassium 3.9, BUN 15, creatinine 0.9. The patient primary care physician is  Dr. Olena Leatherwood and Cardiologist is Dr. Andee Lineman.      MB/MEDQ  D:  04/08/2004  T:  04/08/2004  Job:  [id number]   cc:   Annette Stable Hasanaj  [address]  [place]  [place] [address]  Fax: [phone]   Learta Codding, M.D. [facility]

## 2010-11-03 LAB — CBC
HCT: 37.2
Hemoglobin: 12.8
MCHC: 34.5
RDW: 13.8
WBC: 4.7

## 2010-11-03 LAB — DIFFERENTIAL
Basophils Absolute: 0
Basophils Relative: 0
Eosinophils Absolute: 0.1
Lymphs Abs: 1.5
Monocytes Absolute: 0.3
Monocytes Relative: 6
Neutro Abs: 2.8

## 2011-01-29 DIAGNOSIS — Z79899 Other long term (current) drug therapy: Secondary | ICD-10-CM | POA: Diagnosis not present

## 2011-01-29 DIAGNOSIS — J438 Other emphysema: Secondary | ICD-10-CM | POA: Diagnosis not present

## 2011-01-29 DIAGNOSIS — B9789 Other viral agents as the cause of diseases classified elsewhere: Secondary | ICD-10-CM | POA: Diagnosis not present

## 2011-01-29 DIAGNOSIS — R509 Fever, unspecified: Secondary | ICD-10-CM | POA: Diagnosis not present

## 2011-01-29 DIAGNOSIS — J069 Acute upper respiratory infection, unspecified: Secondary | ICD-10-CM | POA: Diagnosis not present

## 2011-04-06 DIAGNOSIS — O9989 Other specified diseases and conditions complicating pregnancy, childbirth and the puerperium: Secondary | ICD-10-CM | POA: Diagnosis not present

## 2011-04-06 DIAGNOSIS — K219 Gastro-esophageal reflux disease without esophagitis: Secondary | ICD-10-CM | POA: Diagnosis not present

## 2011-04-06 DIAGNOSIS — J449 Chronic obstructive pulmonary disease, unspecified: Secondary | ICD-10-CM | POA: Diagnosis not present

## 2011-04-06 DIAGNOSIS — M549 Dorsalgia, unspecified: Secondary | ICD-10-CM | POA: Diagnosis not present

## 2011-04-06 DIAGNOSIS — M259 Joint disorder, unspecified: Secondary | ICD-10-CM | POA: Diagnosis not present

## 2011-07-07 DIAGNOSIS — G609 Hereditary and idiopathic neuropathy, unspecified: Secondary | ICD-10-CM | POA: Diagnosis not present

## 2011-08-02 ENCOUNTER — Encounter: Payer: Self-pay | Admitting: Internal Medicine

## 2011-08-21 ENCOUNTER — Ambulatory Visit: Payer: Self-pay | Admitting: Gastroenterology

## 2011-08-25 ENCOUNTER — Encounter: Payer: Self-pay | Admitting: Internal Medicine

## 2011-08-28 ENCOUNTER — Ambulatory Visit (INDEPENDENT_AMBULATORY_CARE_PROVIDER_SITE_OTHER): Payer: Medicare Other | Admitting: Gastroenterology

## 2011-08-28 ENCOUNTER — Other Ambulatory Visit: Payer: Self-pay | Admitting: Internal Medicine

## 2011-08-28 ENCOUNTER — Encounter: Payer: Self-pay | Admitting: Gastroenterology

## 2011-08-28 VITALS — BP 129/67 | HR 66 | Temp 98.4°F | Ht 72.0 in | Wt 184.6 lb

## 2011-08-28 DIAGNOSIS — R5383 Other fatigue: Secondary | ICD-10-CM

## 2011-08-28 DIAGNOSIS — K2270 Barrett's esophagus without dysplasia: Secondary | ICD-10-CM

## 2011-08-28 DIAGNOSIS — R197 Diarrhea, unspecified: Secondary | ICD-10-CM

## 2011-08-28 DIAGNOSIS — R109 Unspecified abdominal pain: Secondary | ICD-10-CM | POA: Diagnosis not present

## 2011-08-28 DIAGNOSIS — R110 Nausea: Secondary | ICD-10-CM

## 2011-08-28 DIAGNOSIS — R5381 Other malaise: Secondary | ICD-10-CM

## 2011-08-28 LAB — CBC WITH DIFFERENTIAL/PLATELET
Lymphocytes Relative: 28 % (ref 12–46)
Lymphs Abs: 1.3 10*3/uL (ref 0.7–4.0)
MCHC: 33.2 g/dL (ref 30.0–36.0)
Monocytes Relative: 9 % (ref 3–12)
Neutro Abs: 2.8 10*3/uL (ref 1.7–7.7)
RDW: 14.1 % (ref 11.5–15.5)

## 2011-08-28 MED ORDER — DICYCLOMINE HCL 10 MG PO CAPS: 10.0000 mg | ORAL_CAPSULE | Freq: Two times a day (BID) | ORAL | Status: DC

## 2011-08-28 MED ORDER — PEG 3350-KCL-NA BICARB-NACL 420 G PO SOLR: 4000.0000 L | ORAL | Status: AC

## 2011-08-28 NOTE — Progress Notes (Signed)
Primary Care Physician:  Toma Deiters, MD Primary Gastroenterologist:  Dr. Jena Gauss  Chief Complaint  Patient presents with  . Abdominal Pain  . 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 Needs surveillance colonoscopy due to remote hx of adenomatous polyps. Notes intermittent sharp umbilical pain, nagging for 5-10 minutes. 3-4 loose stools per day, increased in frequency over past few months. No recent abx, changes in meds. No sick contacts. First thing in the morning when waking up, then usually ok the rest of the day. Real "soft". Not watery. No blood in stool. No lack of appetite, wt loss.  Chronic nausea, stays "sick on stomach" all the time. Throw up seldom. Stays swimmy headed. Has to lay down and be still for a whole day before it goes away. Going on about a year or so. +reflux, takes meds daily. No dysphagia.   Complains of abdominal bloating, feels like no energy.    Past Medical History  Diagnosis Date  . Chronic back pain   . GERD (gastroesophageal reflux disease)   . Depression   . Anxiety   . Helicobacter pylori gastritis 2008    Past Surgical History  Procedure Date  . Esophagogastroduodenoscopy 07/28/2009    small HH/2 cm of tongue of salmom colored/? short Barrett's s/p dilationesophageal web, PATH: bening mildly inflamed GE junction mucosa c/.w reflux. negative Barrett's   . Esophagogastroduodenoscopy 06/21/06    small hiatal hernia/tongue salmon colored, PATH: SS  Barrett's. + H.PYLORI GASTRITIS  . Colonoscopy 06/21/06    normal  . Cholecystectomy 1987  . Back surgery 1997  . Abdominal hysterectomy 1981  . Appendectomy 1977  . Colonoscopy 11/25/01    internal hemorrhoids/otherwise normal  . Colonoscopy 11/24/97    mild colitis/hyperplastic polyps  .  Esophagogastroduodenoscopy 07/08/07    Current Outpatient Prescriptions  Medication Sig Dispense Refill  . ALPRAZolam (XANAX) 1 MG tablet Take 1 mg by mouth at bedtime as needed.       Marland Kitchen aspirin 81 MG tablet Take 81 mg by mouth daily.      . citalopram (CELEXA) 40 MG tablet Take 40 mg by mouth daily.       Effie Berkshire 18-103 MCG/ACT inhaler Inhale 1 puff into the lungs 4 (four) times daily.       . fish oil-omega-3 fatty acids 1000 MG capsule Take 2 g by mouth daily.      Marland Kitchen gabapentin (NEURONTIN) 100 MG capsule Take 100 mg by mouth daily.       Marland Kitchen omeprazole (PRILOSEC) 20 MG capsule Take 20 mg by mouth daily.       Marland Kitchen oxyCODONE-acetaminophen (PERCOCET/ROXICET) 5-325 MG per tablet Take 1 tablet by mouth as needed. 1/2 every other day      . promethazine (PHENERGAN) 25 MG tablet Take 12.5 mg by mouth every 6 (six) hours as needed.       . dicyclomine (BENTYL) 10 MG capsule Take 1 capsule (10 mg total) by mouth 2 (two) times daily before a meal.  60 capsule  1    Allergies as of 08/28/2011  . (No Known Allergies)    Family History  Problem Relation Age of Onset  . Colon cancer Neg Hx     History   Social History  . Marital Status: Divorced    Spouse Name: N/A  Number of Children: N/A  . Years of Education: N/A   Occupational History  . Not on file.   Social History Main Topics  . Smoking status: Never Smoker   . Smokeless tobacco: Not on file  . Alcohol Use: No  . Drug Use: No  . Sexually Active: Not on file   Other Topics Concern  . Not on file   Social History Narrative  . No narrative on file    Review of Systems: Gen: Denies any fever, chills, + fatigue, weight loss, lack of appetite.  CV: Denies chest pain, heart palpitations, peripheral edema, syncope.  Resp: +DOE GI: Denies dysphagia or odynophagia. Denies jaundice, hematemesis, fecal incontinence. GU : Denies urinary burning, urinary frequency, urinary hesitancy MS: +back pain Derm: Denies rash, itching,  dry skin Psych: Denies depression, anxiety, memory loss, and confusion Heme: Denies bruising, bleeding, and enlarged lymph nodes.  Physical Exam: BP 129/67  Pulse 66  Temp 98.4 F (36.9 C) (Temporal)  Ht 6' (1.829 m)  Wt 184 lb 9.6 oz (83.734 kg)  BMI 25.04 kg/m2 General:   Alert and oriented. Pleasant and cooperative. Well-nourished and well-developed.  Head:  Normocephalic and atraumatic. Eyes:  Without icterus, sclera clear and conjunctiva pink.  Ears:  Normal auditory acuity. Nose:  No deformity, discharge,  or lesions. Mouth:  No deformity or lesions, oral mucosa pink.  Neck:  Supple, without mass or thyromegaly. Lungs:  Clear to auscultation bilaterally. No wheezes, rales, or rhonchi. No distress.  Heart:  S1, S2 present without murmurs appreciated.  Abdomen:  +BS, soft, non-tender and non-distended. No HSM noted. No guarding or rebound. No masses appreciated.  Rectal:  Deferred  Msk:  Symmetrical without gross deformities. Normal posture. Extremities:  Without clubbing or edema. Neurologic:  Alert and  oriented x4;  grossly normal neurologically. Skin:  Intact without significant lesions or rashes. Cervical Nodes:  No significant cervical adenopathy. Psych:  Alert and cooperative. Normal mood and affect.

## 2011-08-28 NOTE — Patient Instructions (Addendum)
Please complete the blood work and stool studies. We will call you with the results.  We have also set you up for a study to see if your stomach is emptying the way it should.   You will be completing a colonoscopy with Dr. Jena Gauss in the near future.  Finally, I have sent a prescription for Bentyl to take twice a day before meals to help with loose stools and abdominal pain. Please avoid fatty foods and follow a high fiber diet. See attached.   High Fiber Diet A high fiber diet changes your normal diet to include more whole grains, legumes, fruits, and vegetables. Changes in the diet involve replacing refined carbohydrates with unrefined foods. The calorie level of the diet is essentially unchanged. The Dietary Reference Intake (recommended amount) for adult males is 38 g per day. For adult females, it is 25 g per day. Pregnant and lactating women should consume 28 g of fiber per day. Fiber is the intact part of a plant that is not broken down during digestion. Functional fiber is fiber that has been isolated from the plant to provide a beneficial effect in the body. PURPOSE  Increase stool bulk.   Ease and regulate bowel movements.   Lower cholesterol.  INDICATIONS THAT YOU NEED MORE FIBER  Constipation and hemorrhoids.   Uncomplicated diverticulosis (intestine condition) and irritable bowel syndrome.   Weight management.   As a protective measure against hardening of the arteries (atherosclerosis), diabetes, and cancer.  NOTE OF CAUTION If you have a digestive or bowel problem, ask your caregiver for advice before adding high fiber foods to your diet. Some of the following medical problems are such that a high fiber diet should not be used without consulting your caregiver:  Acute diverticulitis (intestine infection).   Partial small bowel obstructions.   Complicated diverticular disease involving bleeding, rupture (perforation), or abscess (boil, furuncle).   Presence of  autonomic neuropathy (nerve damage) or gastric paresis (stomach cannot empty itself).  GUIDELINES FOR INCREASING FIBER  Start adding fiber to the diet slowly. A gradual increase of about 5 more grams (2 slices of whole-wheat bread, 2 servings of most fruits or vegetables, or 1 bowl of high fiber cereal) per day is best. Too rapid an increase in fiber may result in constipation, flatulence, and bloating.   Drink enough water and fluids to keep your urine clear or pale yellow. Water, juice, or caffeine-free drinks are recommended. Not drinking enough fluid may cause constipation.   Eat a variety of high fiber foods rather than one type of fiber.   Try to increase your intake of fiber through using high fiber foods rather than fiber pills or supplements that contain small amounts of fiber.   The goal is to change the types of food eaten. Do not supplement your present diet with high fiber foods, but replace foods in your present diet.  INCLUDE A VARIETY OF FIBER SOURCES  Replace refined and processed grains with whole grains, canned fruits with fresh fruits, and incorporate other fiber sources. White rice, white breads, and most bakery goods contain little or no fiber.   Brown whole-grain rice, buckwheat oats, and many fruits and vegetables are all good sources of fiber. These include: broccoli, Brussels sprouts, cabbage, cauliflower, beets, sweet potatoes, white potatoes (skin on), carrots, tomatoes, eggplant, squash, berries, fresh fruits, and dried fruits.   Cereals appear to be the richest source of fiber. Cereal fiber is found in whole grains and bran. Bran is the  fiber-rich outer coat of cereal grain, which is largely removed in refining. In whole-grain cereals, the bran remains. In breakfast cereals, the largest amount of fiber is found in those with "bran" in their names. The fiber content is sometimes indicated on the label.   You may need to include additional fruits and vegetables each  day.   In baking, for 1 cup white flour, you may use the following substitutions:   1 cup whole-wheat flour minus 2 tbs.    cup white flour plus  cup whole-wheat flour.  Document Released: 01/02/2005 Document Revised: 12/22/2010 Document Reviewed: 11/10/2008 [facility] Patient Information 2012 [place], [place].

## 2011-08-29 LAB — IGA: IgA: 215 mg/dL (ref 69–380)

## 2011-08-30 ENCOUNTER — Encounter: Payer: Self-pay | Admitting: Gastroenterology

## 2011-08-30 ENCOUNTER — Other Ambulatory Visit: Payer: Self-pay | Admitting: Gastroenterology

## 2011-08-31 ENCOUNTER — Encounter (HOSPITAL_COMMUNITY): Payer: Medicare Other

## 2011-08-31 DIAGNOSIS — R197 Diarrhea, unspecified: Secondary | ICD-10-CM | POA: Insufficient documentation

## 2011-08-31 DIAGNOSIS — K2270 Barrett's esophagus without dysplasia: Secondary | ICD-10-CM | POA: Insufficient documentation

## 2011-08-31 DIAGNOSIS — R109 Unspecified abdominal pain: Secondary | ICD-10-CM | POA: Insufficient documentation

## 2011-08-31 DIAGNOSIS — R110 Nausea: Secondary | ICD-10-CM | POA: Insufficient documentation

## 2011-08-31 DIAGNOSIS — R5383 Other fatigue: Secondary | ICD-10-CM | POA: Insufficient documentation

## 2011-08-31 LAB — GIARDIA ANTIGEN: Giardia Screen (EIA): NEGATIVE

## 2011-08-31 NOTE — Assessment & Plan Note (Signed)
71 year old female with chronic abdominal pain, diarrhea, increase in diarrhea over last few months. No recent abx, change in meds. Doubt dealing with infectious/inflammatory process. Will obtain stool studies for completeness. Due for surveillance colonoscopy due to hx of adenomatous polyps. Will trial Bentyl as well.   Proceed with TCS with Dr. Jena Gauss in near future: the risks, benefits, and alternatives have been discussed with the patient in detail. The patient states understanding and desires to proceed. Utilize Phenergan 12.5 mg IV on call due to polypharmacy CBC, TSH, celiac panel Cdiff PCR, Culture, Giardia, Lactoferrin

## 2011-08-31 NOTE — Assessment & Plan Note (Signed)
Chronic. Question r/t IBS, adhesions, function abdominal pain. No physical abnormalities on exam.   Trial Bentyl

## 2011-08-31 NOTE — Progress Notes (Signed)
Quick Note:  Negative celiac. TSH normal. No anemia. All encouraging. Stool studies pending. Continue with TCS. ______

## 2011-08-31 NOTE — Assessment & Plan Note (Addendum)
Hx of short segment Barrett's in remote past, last EGD in 2009 and 2011 normal biopsy. Will need to research when next EGD is due.   Addendum 09/2011: surveillance 2014.

## 2011-08-31 NOTE — Assessment & Plan Note (Addendum)
TSH, CBC. TCS in near future.

## 2011-08-31 NOTE — Progress Notes (Signed)
Faxed to PCP

## 2011-08-31 NOTE — Assessment & Plan Note (Signed)
Chronic. No prior GES. Last EGD in 2011. Question underlying gastroparesis, ?medication related?  GES Continue PPI

## 2011-09-01 LAB — FECAL LACTOFERRIN, QUANT: Lactoferrin: POSITIVE

## 2011-09-03 LAB — STOOL CULTURE

## 2011-09-06 ENCOUNTER — Encounter (HOSPITAL_COMMUNITY): Payer: Self-pay | Admitting: Pharmacy Technician

## 2011-09-14 NOTE — Progress Notes (Signed)
Quick Note:  Pt aware ______ 

## 2011-09-14 NOTE — Progress Notes (Signed)
Quick Note:  Stool studies negative except for +lactoferrin, which is non-specific. ______

## 2011-09-15 MED ORDER — SODIUM CHLORIDE 0.9 % IV SOLN: INTRAVENOUS | Status: DC

## 2011-09-19 ENCOUNTER — Encounter (HOSPITAL_COMMUNITY)
Admission: RE | Disposition: A | Discharge status: Home / Self Care | Payer: Self-pay | Source: Ambulatory Visit | Attending: Internal Medicine

## 2011-09-19 ENCOUNTER — Encounter (HOSPITAL_COMMUNITY): Payer: Self-pay | Admitting: *Deleted

## 2011-09-19 ENCOUNTER — Ambulatory Visit (HOSPITAL_COMMUNITY)
Admission: RE | Admit: 2011-09-19 | Discharge: 2011-09-19 | Disposition: A | Discharge status: Home / Self Care | Payer: Medicare Other | Source: Ambulatory Visit | Attending: Internal Medicine | Admitting: Internal Medicine

## 2011-09-19 DIAGNOSIS — K5730 Diverticulosis of large intestine without perforation or abscess without bleeding: Secondary | ICD-10-CM | POA: Insufficient documentation

## 2011-09-19 DIAGNOSIS — Z860100 Personal history of colon polyps, unspecified: Secondary | ICD-10-CM | POA: Insufficient documentation

## 2011-09-19 DIAGNOSIS — R197 Diarrhea, unspecified: Secondary | ICD-10-CM

## 2011-09-19 DIAGNOSIS — R109 Unspecified abdominal pain: Secondary | ICD-10-CM

## 2011-09-19 DIAGNOSIS — R110 Nausea: Secondary | ICD-10-CM

## 2011-09-19 HISTORY — PX: COLONOSCOPY: SHX5424

## 2011-09-19 SURGERY — COLONOSCOPY
Anesthesia: Moderate Sedation

## 2011-09-19 MED ORDER — PROMETHAZINE HCL 25 MG/ML IJ SOLN
12.5000 mg | Freq: Once | INTRAMUSCULAR | Status: AC
  Administered 2011-09-19: 12.5 mg via INTRAVENOUS

## 2011-09-19 MED ORDER — MEPERIDINE HCL 100 MG/ML IJ SOLN
INTRAMUSCULAR | Status: DC | PRN
  Administered 2011-09-19 (×2): 25 mg via INTRAVENOUS
  Administered 2011-09-19: 50 mg via INTRAVENOUS

## 2011-09-19 MED ORDER — PROMETHAZINE HCL 25 MG/ML IJ SOLN
INTRAMUSCULAR | Status: AC
  Filled 2011-09-19: qty 1

## 2011-09-19 MED ORDER — STERILE WATER FOR IRRIGATION IR SOLN
Status: DC | PRN
  Administered 2011-09-19: 09:00:00

## 2011-09-19 MED ORDER — SODIUM CHLORIDE 0.45 % IV SOLN
INTRAVENOUS | Status: DC
  Administered 2011-09-19: 1000 mL via INTRAVENOUS

## 2011-09-19 MED ORDER — SODIUM CHLORIDE 0.9 % IJ SOLN
INTRAMUSCULAR | Status: AC
  Filled 2011-09-19: qty 10

## 2011-09-19 MED ORDER — MIDAZOLAM HCL 5 MG/5ML IJ SOLN
INTRAMUSCULAR | Status: AC
  Filled 2011-09-19: qty 10

## 2011-09-19 MED ORDER — MIDAZOLAM HCL 5 MG/5ML IJ SOLN
INTRAMUSCULAR | Status: AC
  Filled 2011-09-19: qty 5

## 2011-09-19 MED ORDER — MEPERIDINE HCL 100 MG/ML IJ SOLN
INTRAMUSCULAR | Status: AC
  Filled 2011-09-19: qty 2

## 2011-09-19 MED ORDER — MIDAZOLAM HCL 5 MG/5ML IJ SOLN
INTRAMUSCULAR | Status: DC | PRN
  Administered 2011-09-19: 1 mg via INTRAVENOUS
  Administered 2011-09-19: 2 mg via INTRAVENOUS
  Administered 2011-09-19 (×2): 1 mg via INTRAVENOUS

## 2011-09-19 NOTE — H&P (View-Only) (Signed)
Quick Note:  Negative celiac. TSH normal. No anemia. All encouraging. Stool studies pending. Continue with TCS. ______ 

## 2011-09-19 NOTE — Op Note (Signed)
[facility] [address] [place], [address]   COLONOSCOPY PROCEDURE REPORT  PATIENT: Jessica Gibbs, Jessica Gibbs  MR#:         [id number] BIRTHDATE: Dec 16, 1940 , 71  yrs. old GENDER: Female ENDOSCOPIST: R.  Roetta Sessions, MD FACP FACG REFERRED BY:  Germain Osgood, M.D. PROCEDURE DATE:  09/19/2011 PROCEDURE:    Colonoscopy with segmental biopsy  INDICATIONS: Chronic diarrhea; distant history of colonic adenoma  INFORMED CONSENT:  The risks, benefits, alternatives and imponderables including but not limited to bleeding, perforation as well as the possibility of a missed lesion have been reviewed.  The potential for biopsy, lesion removal, etc. have also been discussed.  Questions have been answered.  All parties agreeable. Please see the history and physical in the medical record for more information.  MEDICATIONS: Versed 6 mg IV and Demerol 100 mg IV in divided doses. Phenergan 12.5 mg IV  DESCRIPTION OF PROCEDURE:  After a digital rectal exam was performed, the EC-[id number] ([id number])  colonoscope was advanced from the anus through the rectum and colon to the area of the cecum, ileocecal valve and appendiceal orifice.  The cecum was deeply intubated.  These structures were well-seen and photographed for the record.  From the level of the cecum and ileocecal valve, the scope was slowly and cautiously withdrawn.  The mucosal surfaces were carefully surveyed utilizing scope tip deflection to facilitate fold flattening as needed.  The scope was pulled down into the rectum where a thorough examination was performed.    FINDINGS:  Adequate preparation. Rectum normal. Rectal vault small unable to retroflex. Scattered left-sided diverticula; her colon mucosa appeared normal. The left side of the colon was stiff. In addition, there was recurrent looping -  had to be dealt with external abdominal pressure and changing the patient'sposition.  THERAPEUTIC / DIAGNOSTIC MANEUVERS  PERFORMED: Biopsies of the descending and sigmoid segments taken to evaluate for microscopic colitis.  COMPLICATIONS: none  CECAL WITHDRAWAL TIME:  8 minutes  IMPRESSION:  Colonic diverticulosis. Status post segmental biopsy  RECOMMENDATIONS: Followup on pathology   _______________________________ eSigned:  R. Roetta Sessions, MD FACP [facility] 09/19/2011 9:28 AM   CC:

## 2011-09-19 NOTE — Interval H&P Note (Signed)
History and Physical Interval Note:  09/19/2011 8:41 AM  Jessica Gibbs  has presented today for surgery, with the diagnosis of Abdominal Pain, Nausea and Diarrhea  The various methods of treatment have been discussed with the patient and family. After consideration of risks, benefits and other options for treatment, the patient has consented to  Procedure(s) (LRB): COLONOSCOPY (N/A) as a surgical intervention .  The patient's history has been reviewed, patient examined, no change in status, stable for surgery.  I have reviewed the patient's chart and labs.  Questions were answered to the patient's satisfaction.     Jessica Gibbs  Celiac negative. Stool studies negative but positive lactoferrin. Plan for a colonoscopy for polyp surveillance and biopsies to check for microscopic colitis

## 2011-09-19 NOTE — Discharge Instructions (Addendum)
Colonoscopy Discharge Instructions  Read the instructions outlined below and refer to this sheet in the next few weeks. These discharge instructions provide you with general information on caring for yourself after you leave the hospital. Your doctor may also give you specific instructions. While your treatment has been planned according to the most current medical practices available, unavoidable complications occasionally occur. If you have any problems or questions after discharge, call Dr. Jena Gauss at [phone]. ACTIVITY  You may resume your regular activity, but move at a slower pace for the next 24 hours.   Take frequent rest periods for the next 24 hours.   Walking will help get rid of the air and reduce the bloated feeling in your belly (abdomen).   No driving for 24 hours (because of the medicine (anesthesia) used during the test).    Do not sign any important legal documents or operate any machinery for 24 hours (because of the anesthesia used during the test).  NUTRITION  Drink plenty of fluids.   You may resume your normal diet as instructed by your doctor.   Begin with a light meal and progress to your normal diet. Heavy or fried foods are harder to digest and may make you feel sick to your stomach (nauseated).   Avoid alcoholic beverages for 24 hours or as instructed.  MEDICATIONS  You may resume your normal medications unless your doctor tells you otherwise.  WHAT YOU CAN EXPECT TODAY  Some feelings of bloating in the abdomen.   Passage of more gas than usual.   Spotting of blood in your stool or on the toilet paper.  IF YOU HAD POLYPS REMOVED DURING THE COLONOSCOPY:  No aspirin products for 7 days or as instructed.   No alcohol for 7 days or as instructed.   Eat a soft diet for the next 24 hours.  FINDING OUT THE RESULTS OF YOUR TEST Not all test results are available during your visit. If your test results are not back during the visit, make an appointment  with your caregiver to find out the results. Do not assume everything is normal if you have not heard from your caregiver or the medical facility. It is important for you to follow up on all of your test results.  SEEK IMMEDIATE MEDICAL ATTENTION IF:  You have more than a spotting of blood in your stool.   Your belly is swollen (abdominal distention).   You are nauseated or vomiting.   You have a temperature over 101.   You have abdominal pain or discomfort that is severe or gets worse throughout the day.      Diverticulosis information provided.  Further recommendations to follow pending review of pathology reportDiverticulosis Diverticulosis is a common condition that develops when small pouches (diverticula) form in the wall of the colon. The risk of diverticulosis increases with age. It happens more often in people who eat a low-fiber diet. Most individuals with diverticulosis have no symptoms. Those individuals with symptoms usually experience abdominal pain, constipation, or loose stools (diarrhea). HOME CARE INSTRUCTIONS   Increase the amount of fiber in your diet as directed by your caregiver or dietician. This may reduce symptoms of diverticulosis.   Your caregiver may recommend taking a dietary fiber supplement.   Drink at least 6 to 8 glasses of water each day to prevent constipation.   Try not to strain when you have a bowel movement.   Your caregiver may recommend avoiding nuts and seeds to prevent complications, although  this is still an uncertain benefit.   Only take over-the-counter or prescription medicines for pain, discomfort, or fever as directed by your caregiver.  FOODS WITH HIGH FIBER CONTENT INCLUDE:  Fruits. Apple, peach, pear, tangerine, raisins, prunes.   Vegetables. Brussels sprouts, asparagus, broccoli, cabbage, carrot, cauliflower, romaine lettuce, spinach, summer squash, tomato, winter squash, zucchini.   Starchy Vegetables. Baked beans, kidney  beans, lima beans, split peas, lentils, potatoes (with skin).   Grains. Whole wheat bread, brown rice, bran flake cereal, plain oatmeal, white rice, shredded wheat, bran muffins.  SEEK IMMEDIATE MEDICAL CARE IF:   You develop increasing pain or severe bloating.   You have an oral temperature above 102 F (38.9 C), not controlled by medicine.   You develop vomiting or bowel movements that are bloody or black.  Document Released: 09/30/2003 Document Revised: 12/22/2010 Document Reviewed: 06/02/2009 [facility] Patient Information 2012 [place], [place].

## 2011-09-24 ENCOUNTER — Encounter: Payer: Self-pay | Admitting: Internal Medicine

## 2011-09-24 DIAGNOSIS — Z79899 Other long term (current) drug therapy: Secondary | ICD-10-CM | POA: Diagnosis not present

## 2011-09-24 DIAGNOSIS — M47817 Spondylosis without myelopathy or radiculopathy, lumbosacral region: Secondary | ICD-10-CM | POA: Diagnosis not present

## 2011-09-24 DIAGNOSIS — Q619 Cystic kidney disease, unspecified: Secondary | ICD-10-CM | POA: Diagnosis not present

## 2011-09-24 DIAGNOSIS — K5730 Diverticulosis of large intestine without perforation or abscess without bleeding: Secondary | ICD-10-CM | POA: Diagnosis not present

## 2011-09-24 DIAGNOSIS — M545 Low back pain: Secondary | ICD-10-CM | POA: Diagnosis not present

## 2011-09-24 DIAGNOSIS — M25559 Pain in unspecified hip: Secondary | ICD-10-CM | POA: Diagnosis not present

## 2011-09-24 DIAGNOSIS — K7689 Other specified diseases of liver: Secondary | ICD-10-CM | POA: Diagnosis not present

## 2011-09-24 DIAGNOSIS — J438 Other emphysema: Secondary | ICD-10-CM | POA: Diagnosis not present

## 2011-09-24 DIAGNOSIS — N281 Cyst of kidney, acquired: Secondary | ICD-10-CM | POA: Diagnosis not present

## 2011-09-24 DIAGNOSIS — M549 Dorsalgia, unspecified: Secondary | ICD-10-CM | POA: Diagnosis not present

## 2011-09-25 ENCOUNTER — Encounter: Payer: Self-pay | Admitting: Internal Medicine

## 2011-09-25 ENCOUNTER — Encounter (HOSPITAL_COMMUNITY): Payer: Self-pay | Admitting: Internal Medicine

## 2011-09-26 DIAGNOSIS — Q619 Cystic kidney disease, unspecified: Secondary | ICD-10-CM | POA: Diagnosis not present

## 2011-09-26 DIAGNOSIS — M549 Dorsalgia, unspecified: Secondary | ICD-10-CM | POA: Diagnosis not present

## 2011-10-03 DIAGNOSIS — Z23 Encounter for immunization: Secondary | ICD-10-CM | POA: Diagnosis not present

## 2011-10-16 ENCOUNTER — Ambulatory Visit: Payer: Medicare Other | Admitting: Gastroenterology

## 2011-10-23 DIAGNOSIS — M549 Dorsalgia, unspecified: Secondary | ICD-10-CM | POA: Diagnosis not present

## 2011-12-18 DIAGNOSIS — F411 Generalized anxiety disorder: Secondary | ICD-10-CM | POA: Diagnosis not present

## 2011-12-18 DIAGNOSIS — F332 Major depressive disorder, recurrent severe without psychotic features: Secondary | ICD-10-CM | POA: Diagnosis not present

## 2012-01-25 DIAGNOSIS — I7389 Other specified peripheral vascular diseases: Secondary | ICD-10-CM | POA: Diagnosis not present

## 2012-02-01 DIAGNOSIS — R209 Unspecified disturbances of skin sensation: Secondary | ICD-10-CM | POA: Diagnosis not present

## 2012-02-01 DIAGNOSIS — F17200 Nicotine dependence, unspecified, uncomplicated: Secondary | ICD-10-CM | POA: Diagnosis not present

## 2012-02-01 DIAGNOSIS — I10 Essential (primary) hypertension: Secondary | ICD-10-CM | POA: Diagnosis not present

## 2012-02-01 DIAGNOSIS — I739 Peripheral vascular disease, unspecified: Secondary | ICD-10-CM | POA: Diagnosis not present

## 2012-04-19 DIAGNOSIS — F411 Generalized anxiety disorder: Secondary | ICD-10-CM | POA: Diagnosis not present

## 2012-04-19 DIAGNOSIS — F332 Major depressive disorder, recurrent severe without psychotic features: Secondary | ICD-10-CM | POA: Diagnosis not present

## 2012-04-25 DIAGNOSIS — J029 Acute pharyngitis, unspecified: Secondary | ICD-10-CM | POA: Diagnosis not present

## 2012-04-25 DIAGNOSIS — Z0000 Encounter for general adult medical examination without abnormal findings: Secondary | ICD-10-CM | POA: Diagnosis not present

## 2012-07-25 DIAGNOSIS — J449 Chronic obstructive pulmonary disease, unspecified: Secondary | ICD-10-CM | POA: Diagnosis not present

## 2012-08-20 DIAGNOSIS — Z79899 Other long term (current) drug therapy: Secondary | ICD-10-CM | POA: Diagnosis not present

## 2012-08-20 DIAGNOSIS — F410 Panic disorder [episodic paroxysmal anxiety] without agoraphobia: Secondary | ICD-10-CM | POA: Diagnosis not present

## 2012-08-20 DIAGNOSIS — F4542 Pain disorder with related psychological factors: Secondary | ICD-10-CM | POA: Diagnosis not present

## 2012-08-20 DIAGNOSIS — F331 Major depressive disorder, recurrent, moderate: Secondary | ICD-10-CM | POA: Diagnosis not present

## 2012-10-15 DIAGNOSIS — S3981XA Other specified injuries of abdomen, initial encounter: Secondary | ICD-10-CM | POA: Diagnosis not present

## 2012-10-15 DIAGNOSIS — Z79899 Other long term (current) drug therapy: Secondary | ICD-10-CM | POA: Diagnosis not present

## 2012-10-15 DIAGNOSIS — R109 Unspecified abdominal pain: Secondary | ICD-10-CM | POA: Diagnosis not present

## 2012-10-15 DIAGNOSIS — S2239XA Fracture of one rib, unspecified side, initial encounter for closed fracture: Secondary | ICD-10-CM | POA: Diagnosis not present

## 2012-10-15 DIAGNOSIS — S0993XA Unspecified injury of face, initial encounter: Secondary | ICD-10-CM | POA: Diagnosis not present

## 2012-10-15 DIAGNOSIS — S0990XA Unspecified injury of head, initial encounter: Secondary | ICD-10-CM | POA: Diagnosis not present

## 2012-10-17 ENCOUNTER — Emergency Department (HOSPITAL_COMMUNITY): Payer: No Typology Code available for payment source

## 2012-10-17 ENCOUNTER — Encounter (HOSPITAL_COMMUNITY): Payer: Self-pay | Admitting: *Deleted

## 2012-10-17 ENCOUNTER — Emergency Department (HOSPITAL_COMMUNITY)
Admission: EM | Admit: 2012-10-17 | Discharge: 2012-10-17 | Disposition: A | Discharge status: Home / Self Care | Payer: No Typology Code available for payment source | Attending: Emergency Medicine | Admitting: Emergency Medicine

## 2012-10-17 DIAGNOSIS — M549 Dorsalgia, unspecified: Secondary | ICD-10-CM | POA: Insufficient documentation

## 2012-10-17 DIAGNOSIS — Z79899 Other long term (current) drug therapy: Secondary | ICD-10-CM | POA: Insufficient documentation

## 2012-10-17 DIAGNOSIS — Z8619 Personal history of other infectious and parasitic diseases: Secondary | ICD-10-CM | POA: Insufficient documentation

## 2012-10-17 DIAGNOSIS — R079 Chest pain, unspecified: Secondary | ICD-10-CM | POA: Diagnosis not present

## 2012-10-17 DIAGNOSIS — F329 Major depressive disorder, single episode, unspecified: Secondary | ICD-10-CM | POA: Insufficient documentation

## 2012-10-17 DIAGNOSIS — R51 Headache: Secondary | ICD-10-CM | POA: Insufficient documentation

## 2012-10-17 DIAGNOSIS — M542 Cervicalgia: Secondary | ICD-10-CM | POA: Insufficient documentation

## 2012-10-17 DIAGNOSIS — S298XXA Other specified injuries of thorax, initial encounter: Secondary | ICD-10-CM | POA: Diagnosis not present

## 2012-10-17 DIAGNOSIS — F411 Generalized anxiety disorder: Secondary | ICD-10-CM | POA: Insufficient documentation

## 2012-10-17 DIAGNOSIS — G8911 Acute pain due to trauma: Secondary | ICD-10-CM | POA: Insufficient documentation

## 2012-10-17 DIAGNOSIS — G8929 Other chronic pain: Secondary | ICD-10-CM | POA: Insufficient documentation

## 2012-10-17 DIAGNOSIS — Z7982 Long term (current) use of aspirin: Secondary | ICD-10-CM | POA: Insufficient documentation

## 2012-10-17 DIAGNOSIS — F3289 Other specified depressive episodes: Secondary | ICD-10-CM | POA: Insufficient documentation

## 2012-10-17 DIAGNOSIS — K219 Gastro-esophageal reflux disease without esophagitis: Secondary | ICD-10-CM | POA: Insufficient documentation

## 2012-10-17 DIAGNOSIS — S2341XA Sprain of ribs, initial encounter: Secondary | ICD-10-CM

## 2012-10-17 MED ORDER — HYDROCODONE-ACETAMINOPHEN 5-325 MG PO TABS
1.0000 | ORAL_TABLET | Freq: Once | ORAL | Status: AC
  Administered 2012-10-17: 1 via ORAL
  Filled 2012-10-17: qty 1

## 2012-10-17 MED ORDER — ALBUTEROL SULFATE HFA 108 (90 BASE) MCG/ACT IN AERS: 2.0000 | INHALATION_SPRAY | Freq: Once | RESPIRATORY_TRACT | Status: DC
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
## 2012-10-17 NOTE — ED Notes (Signed)
Pt alert & oriented x4, stable gait. Patient given discharge instructions, paperwork & prescription(s). Patient  instructed to stop at the registration desk to finish any additional paperwork. Patient verbalized understanding. Pt left department w/ no further questions. 

## 2012-10-25 DIAGNOSIS — Z23 Encounter for immunization: Secondary | ICD-10-CM | POA: Diagnosis not present

## 2012-11-05 DIAGNOSIS — R209 Unspecified disturbances of skin sensation: Secondary | ICD-10-CM | POA: Diagnosis not present

## 2012-11-05 DIAGNOSIS — S0990XA Unspecified injury of head, initial encounter: Secondary | ICD-10-CM | POA: Diagnosis not present

## 2012-11-05 DIAGNOSIS — R51 Headache: Secondary | ICD-10-CM | POA: Diagnosis not present

## 2012-11-05 DIAGNOSIS — M542 Cervicalgia: Secondary | ICD-10-CM | POA: Diagnosis not present

## 2012-11-12 DIAGNOSIS — M542 Cervicalgia: Secondary | ICD-10-CM | POA: Diagnosis not present

## 2012-11-12 DIAGNOSIS — M6281 Muscle weakness (generalized): Secondary | ICD-10-CM | POA: Diagnosis not present

## 2012-11-12 DIAGNOSIS — IMO0001 Reserved for inherently not codable concepts without codable children: Secondary | ICD-10-CM | POA: Diagnosis not present

## 2012-12-05 ENCOUNTER — Emergency Department (HOSPITAL_COMMUNITY)
Admission: EM | Admit: 2012-12-05 | Discharge: 2012-12-05 | Disposition: A | Discharge status: Home / Self Care | Payer: No Typology Code available for payment source | Attending: Emergency Medicine | Admitting: Emergency Medicine

## 2012-12-05 ENCOUNTER — Encounter (HOSPITAL_COMMUNITY): Payer: Self-pay | Admitting: Emergency Medicine

## 2012-12-05 DIAGNOSIS — Z79899 Other long term (current) drug therapy: Secondary | ICD-10-CM | POA: Insufficient documentation

## 2012-12-05 DIAGNOSIS — R51 Headache: Secondary | ICD-10-CM | POA: Diagnosis not present

## 2012-12-05 DIAGNOSIS — F329 Major depressive disorder, single episode, unspecified: Secondary | ICD-10-CM | POA: Insufficient documentation

## 2012-12-05 DIAGNOSIS — Z8619 Personal history of other infectious and parasitic diseases: Secondary | ICD-10-CM | POA: Insufficient documentation

## 2012-12-05 DIAGNOSIS — Z7982 Long term (current) use of aspirin: Secondary | ICD-10-CM | POA: Insufficient documentation

## 2012-12-05 DIAGNOSIS — K219 Gastro-esophageal reflux disease without esophagitis: Secondary | ICD-10-CM | POA: Insufficient documentation

## 2012-12-05 DIAGNOSIS — I498 Other specified cardiac arrhythmias: Secondary | ICD-10-CM | POA: Diagnosis not present

## 2012-12-05 DIAGNOSIS — G8929 Other chronic pain: Secondary | ICD-10-CM | POA: Insufficient documentation

## 2012-12-05 DIAGNOSIS — G8911 Acute pain due to trauma: Secondary | ICD-10-CM | POA: Insufficient documentation

## 2012-12-05 DIAGNOSIS — F411 Generalized anxiety disorder: Secondary | ICD-10-CM | POA: Insufficient documentation

## 2012-12-05 DIAGNOSIS — F3289 Other specified depressive episodes: Secondary | ICD-10-CM | POA: Insufficient documentation

## 2012-12-05 MED ORDER — HYDROMORPHONE HCL PF 1 MG/ML IJ SOLN
0.5000 mg | Freq: Once | INTRAMUSCULAR | Status: AC
  Administered 2012-12-05: 0.5 mg via INTRAMUSCULAR
  Filled 2012-12-05: qty 1

## 2012-12-05 MED ORDER — PREDNISONE 20 MG PO TABS: ORAL_TABLET | ORAL | Status: DC

## 2012-12-05 MED ORDER — CYCLOBENZAPRINE HCL 10 MG PO TABS: 10.0000 mg | ORAL_TABLET | Freq: Two times a day (BID) | ORAL | Status: DC | PRN

## 2012-12-05 MED ORDER — KETOROLAC TROMETHAMINE 30 MG/ML IJ SOLN
30.0000 mg | Freq: Once | INTRAMUSCULAR | Status: AC
  Administered 2012-12-05: 30 mg via INTRAMUSCULAR
  Filled 2012-12-05: qty 1
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
## 2012-12-09 DIAGNOSIS — M542 Cervicalgia: Secondary | ICD-10-CM | POA: Diagnosis not present

## 2012-12-09 DIAGNOSIS — M6281 Muscle weakness (generalized): Secondary | ICD-10-CM | POA: Diagnosis not present

## 2012-12-09 DIAGNOSIS — IMO0001 Reserved for inherently not codable concepts without codable children: Secondary | ICD-10-CM | POA: Diagnosis not present

## 2012-12-10 DIAGNOSIS — M542 Cervicalgia: Secondary | ICD-10-CM | POA: Diagnosis not present

## 2012-12-10 DIAGNOSIS — IMO0001 Reserved for inherently not codable concepts without codable children: Secondary | ICD-10-CM | POA: Diagnosis not present

## 2012-12-10 DIAGNOSIS — M6281 Muscle weakness (generalized): Secondary | ICD-10-CM | POA: Diagnosis not present

## 2012-12-16 DIAGNOSIS — F4310 Post-traumatic stress disorder, unspecified: Secondary | ICD-10-CM | POA: Diagnosis not present

## 2012-12-17 ENCOUNTER — Ambulatory Visit: Payer: Self-pay | Admitting: Neurology

## 2013-01-21 DIAGNOSIS — M542 Cervicalgia: Secondary | ICD-10-CM | POA: Diagnosis not present

## 2013-02-07 ENCOUNTER — Encounter (INDEPENDENT_AMBULATORY_CARE_PROVIDER_SITE_OTHER): Payer: Self-pay

## 2013-02-07 ENCOUNTER — Encounter: Payer: Self-pay | Admitting: Neurology

## 2013-02-07 ENCOUNTER — Ambulatory Visit (INDEPENDENT_AMBULATORY_CARE_PROVIDER_SITE_OTHER): Payer: Medicare Other | Admitting: Neurology

## 2013-02-07 VITALS — BP 157/69 | HR 52 | Ht 72.0 in | Wt 199.0 lb

## 2013-02-07 DIAGNOSIS — R51 Headache: Secondary | ICD-10-CM

## 2013-02-07 DIAGNOSIS — R519 Headache, unspecified: Secondary | ICD-10-CM | POA: Insufficient documentation

## 2013-02-07 DIAGNOSIS — R209 Unspecified disturbances of skin sensation: Secondary | ICD-10-CM | POA: Diagnosis not present

## 2013-02-07 DIAGNOSIS — R200 Anesthesia of skin: Secondary | ICD-10-CM

## 2013-02-07 MED ORDER — NORTRIPTYLINE HCL 25 MG PO CAPS: ORAL_CAPSULE | ORAL | Status: DC
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
## 2013-02-11 ENCOUNTER — Ambulatory Visit (INDEPENDENT_AMBULATORY_CARE_PROVIDER_SITE_OTHER): Payer: Medicare Other | Admitting: Neurology

## 2013-02-11 ENCOUNTER — Encounter (INDEPENDENT_AMBULATORY_CARE_PROVIDER_SITE_OTHER): Payer: Self-pay

## 2013-02-11 DIAGNOSIS — M542 Cervicalgia: Secondary | ICD-10-CM | POA: Insufficient documentation

## 2013-02-11 DIAGNOSIS — R209 Unspecified disturbances of skin sensation: Secondary | ICD-10-CM

## 2013-02-11 DIAGNOSIS — Z0289 Encounter for other administrative examinations: Secondary | ICD-10-CM

## 2013-02-11 DIAGNOSIS — R200 Anesthesia of skin: Secondary | ICD-10-CM

## 2013-02-11 DIAGNOSIS — R51 Headache: Secondary | ICD-10-CM
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
## 2013-02-13 DIAGNOSIS — H2510 Age-related nuclear cataract, unspecified eye: Secondary | ICD-10-CM | POA: Diagnosis not present

## 2013-02-13 DIAGNOSIS — H35369 Drusen (degenerative) of macula, unspecified eye: Secondary | ICD-10-CM | POA: Diagnosis not present

## 2013-02-13 DIAGNOSIS — H524 Presbyopia: Secondary | ICD-10-CM | POA: Diagnosis not present

## 2013-02-13 DIAGNOSIS — H5200 Hypermetropia, unspecified eye: Secondary | ICD-10-CM | POA: Diagnosis not present

## 2013-02-25 ENCOUNTER — Ambulatory Visit
Admission: RE | Admit: 2013-02-25 | Discharge: 2013-02-25 | Disposition: A | Discharge status: Home / Self Care | Payer: Medicare Other | Source: Ambulatory Visit | Attending: Neurology | Admitting: Neurology

## 2013-02-25 DIAGNOSIS — R200 Anesthesia of skin: Secondary | ICD-10-CM

## 2013-02-25 DIAGNOSIS — R209 Unspecified disturbances of skin sensation: Secondary | ICD-10-CM | POA: Diagnosis not present

## 2013-02-25 DIAGNOSIS — R51 Headache: Secondary | ICD-10-CM

## 2013-02-28 ENCOUNTER — Telehealth: Payer: Self-pay | Admitting: Neurology

## 2013-02-28 NOTE — Telephone Encounter (Signed)
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  MRI brain showed age related changes.  Dana: give her a followup appointment with Hoyle Sauer in 2 months

## 2013-04-15 ENCOUNTER — Telehealth: Payer: Self-pay

## 2013-04-15 DIAGNOSIS — F411 Generalized anxiety disorder: Secondary | ICD-10-CM | POA: Diagnosis not present

## 2013-04-15 DIAGNOSIS — F331 Major depressive disorder, recurrent, moderate: Secondary | ICD-10-CM | POA: Diagnosis not present

## 2013-04-15 NOTE — Telephone Encounter (Signed)
Called and spoke with patient's son. Patient needs a follow up with Dr.Yan in May. Jessica Gibbs has no openings until July. Patient will call the office back when she returns home.

## 2013-04-16 NOTE — Telephone Encounter (Signed)
Called patient back and her Son answered again states that his mom was not there and he will tell her I called again.

## 2013-06-12 ENCOUNTER — Telehealth: Payer: Self-pay | Admitting: Nurse Practitioner

## 2013-06-12 ENCOUNTER — Ambulatory Visit: Payer: Self-pay | Admitting: Nurse Practitioner

## 2013-06-12 NOTE — Telephone Encounter (Signed)
No show for scheduled appointment

## 2013-06-26 DIAGNOSIS — M542 Cervicalgia: Secondary | ICD-10-CM | POA: Diagnosis not present

## 2013-06-26 DIAGNOSIS — M549 Dorsalgia, unspecified: Secondary | ICD-10-CM | POA: Diagnosis not present

## 2013-08-15 NOTE — Telephone Encounter (Signed)
Per Hinton Dyer C note flag- 05/30/13,patient wanted to wait to schedule appt.

## 2013-08-26 DIAGNOSIS — M542 Cervicalgia: Secondary | ICD-10-CM | POA: Diagnosis not present

## 2013-10-23 DIAGNOSIS — Z23 Encounter for immunization: Secondary | ICD-10-CM | POA: Diagnosis not present

## 2013-10-28 DIAGNOSIS — M542 Cervicalgia: Secondary | ICD-10-CM | POA: Diagnosis not present

## 2013-11-04 DIAGNOSIS — Z79899 Other long term (current) drug therapy: Secondary | ICD-10-CM | POA: Diagnosis not present

## 2013-11-04 DIAGNOSIS — F411 Generalized anxiety disorder: Secondary | ICD-10-CM | POA: Diagnosis not present

## 2014-01-29 DIAGNOSIS — Z1389 Encounter for screening for other disorder: Secondary | ICD-10-CM | POA: Diagnosis not present

## 2014-01-29 DIAGNOSIS — M549 Dorsalgia, unspecified: Secondary | ICD-10-CM | POA: Diagnosis not present

## 2014-01-29 DIAGNOSIS — Z0000 Encounter for general adult medical examination without abnormal findings: Secondary | ICD-10-CM | POA: Diagnosis not present

## 2014-02-02 DIAGNOSIS — F411 Generalized anxiety disorder: Secondary | ICD-10-CM | POA: Diagnosis not present

## 2014-02-02 DIAGNOSIS — F419 Anxiety disorder, unspecified: Secondary | ICD-10-CM | POA: Diagnosis not present

## 2014-04-29 DIAGNOSIS — K219 Gastro-esophageal reflux disease without esophagitis: Secondary | ICD-10-CM | POA: Diagnosis not present

## 2014-04-29 DIAGNOSIS — M549 Dorsalgia, unspecified: Secondary | ICD-10-CM | POA: Diagnosis not present

## 2014-04-29 DIAGNOSIS — M542 Cervicalgia: Secondary | ICD-10-CM | POA: Diagnosis not present

## 2014-05-14 DIAGNOSIS — Z1231 Encounter for screening mammogram for malignant neoplasm of breast: Secondary | ICD-10-CM | POA: Diagnosis not present

## 2014-05-29 DIAGNOSIS — F331 Major depressive disorder, recurrent, moderate: Secondary | ICD-10-CM | POA: Diagnosis not present

## 2014-06-02 DIAGNOSIS — Z79899 Other long term (current) drug therapy: Secondary | ICD-10-CM | POA: Diagnosis not present

## 2014-06-02 DIAGNOSIS — F4000 Agoraphobia, unspecified: Secondary | ICD-10-CM | POA: Diagnosis not present

## 2014-06-02 DIAGNOSIS — F330 Major depressive disorder, recurrent, mild: Secondary | ICD-10-CM | POA: Diagnosis not present

## 2014-06-12 DIAGNOSIS — J449 Chronic obstructive pulmonary disease, unspecified: Secondary | ICD-10-CM | POA: Diagnosis not present

## 2014-06-12 DIAGNOSIS — F17200 Nicotine dependence, unspecified, uncomplicated: Secondary | ICD-10-CM | POA: Diagnosis not present

## 2014-06-12 DIAGNOSIS — F419 Anxiety disorder, unspecified: Secondary | ICD-10-CM | POA: Diagnosis not present

## 2014-06-12 DIAGNOSIS — K219 Gastro-esophageal reflux disease without esophagitis: Secondary | ICD-10-CM | POA: Diagnosis not present

## 2014-06-12 DIAGNOSIS — Z7982 Long term (current) use of aspirin: Secondary | ICD-10-CM | POA: Diagnosis not present

## 2014-06-12 DIAGNOSIS — Z79891 Long term (current) use of opiate analgesic: Secondary | ICD-10-CM | POA: Diagnosis not present

## 2014-06-12 DIAGNOSIS — F418 Other specified anxiety disorders: Secondary | ICD-10-CM | POA: Diagnosis not present

## 2014-06-12 DIAGNOSIS — F329 Major depressive disorder, single episode, unspecified: Secondary | ICD-10-CM | POA: Diagnosis not present

## 2014-06-12 DIAGNOSIS — G894 Chronic pain syndrome: Secondary | ICD-10-CM | POA: Diagnosis not present

## 2014-06-12 DIAGNOSIS — R0789 Other chest pain: Secondary | ICD-10-CM | POA: Diagnosis not present

## 2014-06-12 DIAGNOSIS — I517 Cardiomegaly: Secondary | ICD-10-CM | POA: Diagnosis not present

## 2014-06-12 DIAGNOSIS — R079 Chest pain, unspecified: Secondary | ICD-10-CM | POA: Diagnosis not present

## 2014-06-13 DIAGNOSIS — G894 Chronic pain syndrome: Secondary | ICD-10-CM | POA: Diagnosis not present

## 2014-06-13 DIAGNOSIS — J449 Chronic obstructive pulmonary disease, unspecified: Secondary | ICD-10-CM | POA: Diagnosis not present

## 2014-06-13 DIAGNOSIS — K219 Gastro-esophageal reflux disease without esophagitis: Secondary | ICD-10-CM | POA: Diagnosis not present

## 2014-06-13 DIAGNOSIS — R079 Chest pain, unspecified: Secondary | ICD-10-CM | POA: Diagnosis not present

## 2014-06-13 DIAGNOSIS — F419 Anxiety disorder, unspecified: Secondary | ICD-10-CM | POA: Diagnosis not present

## 2014-06-29 DIAGNOSIS — M549 Dorsalgia, unspecified: Secondary | ICD-10-CM | POA: Diagnosis not present

## 2014-06-29 DIAGNOSIS — K219 Gastro-esophageal reflux disease without esophagitis: Secondary | ICD-10-CM | POA: Diagnosis not present

## 2014-09-28 DIAGNOSIS — R109 Unspecified abdominal pain: Secondary | ICD-10-CM | POA: Diagnosis not present

## 2014-09-28 DIAGNOSIS — J449 Chronic obstructive pulmonary disease, unspecified: Secondary | ICD-10-CM | POA: Diagnosis not present

## 2014-09-28 DIAGNOSIS — Z23 Encounter for immunization: Secondary | ICD-10-CM | POA: Diagnosis not present

## 2014-09-28 DIAGNOSIS — M545 Low back pain: Secondary | ICD-10-CM | POA: Diagnosis not present

## 2014-09-28 DIAGNOSIS — Z136 Encounter for screening for cardiovascular disorders: Secondary | ICD-10-CM | POA: Diagnosis not present

## 2014-10-02 DIAGNOSIS — F410 Panic disorder [episodic paroxysmal anxiety] without agoraphobia: Secondary | ICD-10-CM | POA: Diagnosis not present

## 2014-10-09 DIAGNOSIS — F410 Panic disorder [episodic paroxysmal anxiety] without agoraphobia: Secondary | ICD-10-CM | POA: Diagnosis not present

## 2014-11-02 DIAGNOSIS — S91114A Laceration without foreign body of right lesser toe(s) without damage to nail, initial encounter: Secondary | ICD-10-CM | POA: Diagnosis not present

## 2014-11-02 DIAGNOSIS — S51809A Unspecified open wound of unspecified forearm, initial encounter: Secondary | ICD-10-CM | POA: Diagnosis not present

## 2014-11-02 DIAGNOSIS — S61432A Puncture wound without foreign body of left hand, initial encounter: Secondary | ICD-10-CM | POA: Diagnosis not present

## 2014-11-02 DIAGNOSIS — S80812A Abrasion, left lower leg, initial encounter: Secondary | ICD-10-CM | POA: Diagnosis not present

## 2014-11-02 DIAGNOSIS — S50811A Abrasion of right forearm, initial encounter: Secondary | ICD-10-CM | POA: Diagnosis not present

## 2014-11-02 DIAGNOSIS — K219 Gastro-esophageal reflux disease without esophagitis: Secondary | ICD-10-CM | POA: Diagnosis not present

## 2014-11-02 DIAGNOSIS — F419 Anxiety disorder, unspecified: Secondary | ICD-10-CM | POA: Diagnosis not present

## 2014-11-02 DIAGNOSIS — S61459A Open bite of unspecified hand, initial encounter: Secondary | ICD-10-CM | POA: Diagnosis not present

## 2014-11-02 DIAGNOSIS — S61412A Laceration without foreign body of left hand, initial encounter: Secondary | ICD-10-CM | POA: Diagnosis not present

## 2014-11-02 DIAGNOSIS — S51832A Puncture wound without foreign body of left forearm, initial encounter: Secondary | ICD-10-CM | POA: Diagnosis not present

## 2014-11-02 DIAGNOSIS — F329 Major depressive disorder, single episode, unspecified: Secondary | ICD-10-CM | POA: Diagnosis not present

## 2014-11-02 DIAGNOSIS — Z87891 Personal history of nicotine dependence: Secondary | ICD-10-CM | POA: Diagnosis not present

## 2014-11-02 DIAGNOSIS — W540XXA Bitten by dog, initial encounter: Secondary | ICD-10-CM | POA: Diagnosis not present

## 2014-11-02 DIAGNOSIS — Z79899 Other long term (current) drug therapy: Secondary | ICD-10-CM | POA: Diagnosis not present

## 2014-11-02 DIAGNOSIS — Z23 Encounter for immunization: Secondary | ICD-10-CM | POA: Diagnosis not present

## 2014-11-02 DIAGNOSIS — S92524A Nondisplaced fracture of medial phalanx of right lesser toe(s), initial encounter for closed fracture: Secondary | ICD-10-CM | POA: Diagnosis not present

## 2014-11-05 DIAGNOSIS — H5203 Hypermetropia, bilateral: Secondary | ICD-10-CM | POA: Diagnosis not present

## 2014-11-05 DIAGNOSIS — H2513 Age-related nuclear cataract, bilateral: Secondary | ICD-10-CM | POA: Diagnosis not present

## 2014-11-05 DIAGNOSIS — H524 Presbyopia: Secondary | ICD-10-CM | POA: Diagnosis not present

## 2014-11-05 DIAGNOSIS — H52223 Regular astigmatism, bilateral: Secondary | ICD-10-CM | POA: Diagnosis not present

## 2014-12-01 DIAGNOSIS — R0682 Tachypnea, not elsewhere classified: Secondary | ICD-10-CM | POA: Diagnosis not present

## 2014-12-01 DIAGNOSIS — R0789 Other chest pain: Secondary | ICD-10-CM | POA: Diagnosis not present

## 2014-12-01 DIAGNOSIS — R000 Tachycardia, unspecified: Secondary | ICD-10-CM | POA: Diagnosis not present

## 2014-12-28 DIAGNOSIS — K210 Gastro-esophageal reflux disease with esophagitis: Secondary | ICD-10-CM | POA: Diagnosis not present

## 2014-12-28 DIAGNOSIS — J440 Chronic obstructive pulmonary disease with acute lower respiratory infection: Secondary | ICD-10-CM | POA: Diagnosis not present

## 2015-01-14 DIAGNOSIS — H2513 Age-related nuclear cataract, bilateral: Secondary | ICD-10-CM | POA: Diagnosis not present

## 2015-01-14 DIAGNOSIS — D210 Benign neoplasm of connective and other soft tissue of head, face and neck: Secondary | ICD-10-CM | POA: Diagnosis not present

## 2015-01-14 DIAGNOSIS — H5203 Hypermetropia, bilateral: Secondary | ICD-10-CM | POA: Diagnosis not present

## 2015-01-14 DIAGNOSIS — H2511 Age-related nuclear cataract, right eye: Secondary | ICD-10-CM | POA: Diagnosis not present

## 2015-01-14 DIAGNOSIS — H524 Presbyopia: Secondary | ICD-10-CM | POA: Diagnosis not present

## 2015-01-19 NOTE — Patient Instructions (Signed)
Jessica Gibbs  01/19/2015     '@PREFPERIOPPHARMACY'$ @   Your procedure is scheduled on 01/25/2015.  Report to [facility] at 8:30 A.M.  Call this number if you have problems the morning of surgery:  [phone]   Remember:  Do not eat food or drink liquids after midnight.  Take these medicines the morning of surgery with A SIP OF WATER Xanax, Celexa, Combivent inhaler (bring to hospital), Gaapentin, Pamelor, Prilosec, Oxycodone if needed, Phenergan if needed   Do not wear jewelry, make-up or nail polish.  Do not wear lotions, powders, or perfumes.  You may wear deodorant.  Do not shave 48 hours prior to surgery.  Men may shave face and neck.  Do not bring valuables to the hospital.  [facility] is not responsible for any belongings or valuables.  Contacts, dentures or bridgework may not be worn into surgery.  Leave your suitcase in the car.  After surgery it may be brought to your room.  For patients admitted to the hospital, discharge time will be determined by your treatment team.  Patients discharged the day of surgery will not be allowed to drive home.    Please read over the following fact sheets that you were given. Anesthesia Post-op Instructions     PATIENT INSTRUCTIONS POST-ANESTHESIA  IMMEDIATELY FOLLOWING SURGERY:  Do not drive or operate machinery for the first twenty four hours after surgery.  Do not make any important decisions for twenty four hours after surgery or while taking narcotic pain medications or sedatives.  If you develop intractable nausea and vomiting or a severe headache please notify your doctor immediately.  FOLLOW-UP:  Please make an appointment with your surgeon as instructed. You do not need to follow up with anesthesia unless specifically instructed to do so.  WOUND CARE INSTRUCTIONS (if applicable):  Keep a dry clean dressing on the anesthesia/puncture wound site if there is drainage.  Once the wound has quit draining you may leave it open to  air.  Generally you should leave the bandage intact for twenty four hours unless there is drainage.  If the epidural site drains for more than 36-48 hours please call the anesthesia department.  QUESTIONS?:  Please feel free to call your physician or the hospital operator if you have any questions, and they will be happy to assist you.       A cataract is a clouding of the lens of the eye. When a lens becomes cloudy, vision is reduced based on the degree and nature of the clouding. Surgery may be needed to improve vision. Surgery removes the cloudy lens and usually replaces it with a substitute lens (intraocular lens, IOL). LET YOUR EYE DOCTOR KNOW ABOUT:  Allergies to food or medicine.  Medicines taken including herbs, eye drops, over-the-counter medicines, and creams.  Use of steroids (by mouth or creams).  Previous problems with anesthetics or numbing medicine.  History of bleeding problems or blood clots.  Previous surgery.  Other health problems, including diabetes and kidney problems.  Possibility of pregnancy, if this applies. RISKS AND COMPLICATIONS  Infection.  Inflammation of the eyeball (endophthalmitis) that can spread to both eyes (sympathetic ophthalmia).  Poor wound healing.  If an IOL is inserted, it can later fall out of proper position. This is very uncommon.  Clouding of the part of your eye that holds an IOL in place. This is called an "after-cataract." These are uncommon but easily treated. BEFORE THE PROCEDURE  Do not eat or  drink anything except small amounts of water for 8 to 12 before your surgery, or as directed by your caregiver.  Unless you are told otherwise, continue any eye drops you have been prescribed.  Talk to your primary caregiver about all other medicines that you take (both prescription and nonprescription). In some cases, you may need to stop or change medicines near the time of your surgery. This is most important if you are taking  blood-thinning medicine.Do not stop medicines unless you are told to do so.  Arrange for someone to drive you to and from the procedure.  Do not put contact lenses in either eye on the day of your surgery. PROCEDURE There is more than one method for safely removing a cataract. Your doctor can explain the differences and help determine which is best for you. Phacoemulsification surgery is the most common form of cataract surgery.  An injection is given behind the eye or eye drops are given to make this a painless procedure.  A small cut (incision) is made on the edge of the clear, dome-shaped surface that covers the front of the eye (cornea).  A tiny probe is painlessly inserted into the eye. This device gives off ultrasound waves that soften and break up the cloudy center of the lens. This makes it easier for the cloudy lens to be removed by suction.  An IOL may be implanted.  The normal lens of the eye is covered by a clear capsule. Part of that capsule is intentionally left in the eye to support the IOL.  Your surgeon may or may not use stitches to close the incision. There are other forms of cataract surgery that require a larger incision and stitches to close the eye. This approach is taken in cases where the doctor feels that the cataract cannot be easily removed using phacoemulsification. AFTER THE PROCEDURE  When an IOL is implanted, it does not need care. It becomes a permanent part of your eye and cannot be seen or felt.  Your doctor will schedule follow-up exams to check on your progress.  Review your other medicines with your doctor to see which can be resumed after surgery.  Use eye drops or take medicine as prescribed by your doctor.   This information is not intended to replace advice given to you by your health care provider. Make sure you discuss any questions you have with your health care provider.   Document Released: 12/22/2010 Document Revised: 01/23/2014  Document Reviewed: 12/22/2010 Elsevier Interactive Patient Education [employer].

## 2015-01-20 ENCOUNTER — Other Ambulatory Visit: Payer: Self-pay

## 2015-01-20 ENCOUNTER — Encounter (HOSPITAL_COMMUNITY)
Admission: RE | Admit: 2015-01-20 | Discharge: 2015-01-20 | Disposition: A | Discharge status: Home / Self Care | Payer: Medicare Other | Source: Ambulatory Visit | Attending: Ophthalmology | Admitting: Ophthalmology

## 2015-01-20 ENCOUNTER — Encounter (HOSPITAL_COMMUNITY): Payer: Self-pay

## 2015-01-20 DIAGNOSIS — Z01818 Encounter for other preprocedural examination: Secondary | ICD-10-CM | POA: Diagnosis not present

## 2015-01-20 LAB — CBC
HCT: 39.5 % (ref 36.0–46.0)
HEMOGLOBIN: 13.2 g/dL (ref 12.0–15.0)
MCH: 30.4 pg (ref 26.0–34.0)
MCHC: 33.4 g/dL (ref 30.0–36.0)
MCV: 91 fL (ref 78.0–100.0)
Platelets: 160 10*3/uL (ref 150–400)
RBC: 4.34 MIL/uL (ref 3.87–5.11)
RDW: 12.9 % (ref 11.5–15.5)
WBC: 5.1 10*3/uL (ref 4.0–10.5)

## 2015-01-20 LAB — BASIC METABOLIC PANEL
Anion gap: 4 — ABNORMAL LOW (ref 5–15)
BUN: 23 mg/dL — ABNORMAL HIGH (ref 6–20)
CO2: 28 mmol/L (ref 22–32)
Calcium: 9 mg/dL (ref 8.9–10.3)
Chloride: 103 mmol/L (ref 101–111)
Creatinine, Ser: 1 mg/dL (ref 0.44–1.00)
GFR calc non Af Amer: 53 mL/min — ABNORMAL LOW (ref >60)
Glucose, Bld: 85 mg/dL (ref 65–99)
POTASSIUM: 4.3 mmol/L (ref 3.5–5.1)
SODIUM: 135 mmol/L (ref 135–145)

## 2015-02-08 ENCOUNTER — Encounter (HOSPITAL_COMMUNITY)
Admission: RE | Admit: 2015-02-08 | Discharge: 2015-02-08 | Disposition: A | Discharge status: Home / Self Care | Payer: Medicare Other | Source: Ambulatory Visit | Attending: Ophthalmology | Admitting: Ophthalmology

## 2015-02-11 ENCOUNTER — Encounter (HOSPITAL_COMMUNITY)
Admission: RE | Disposition: A | Discharge status: Home / Self Care | Payer: Self-pay | Source: Ambulatory Visit | Attending: Ophthalmology

## 2015-02-11 ENCOUNTER — Ambulatory Visit (HOSPITAL_COMMUNITY): Payer: Medicare Other | Admitting: Anesthesiology

## 2015-02-11 ENCOUNTER — Encounter (HOSPITAL_COMMUNITY): Payer: Self-pay | Admitting: *Deleted

## 2015-02-11 ENCOUNTER — Ambulatory Visit (HOSPITAL_COMMUNITY)
Admission: RE | Admit: 2015-02-11 | Discharge: 2015-02-11 | Disposition: A | Discharge status: Home / Self Care | Payer: Medicare Other | Source: Ambulatory Visit | Attending: Ophthalmology | Admitting: Ophthalmology

## 2015-02-11 DIAGNOSIS — Z7982 Long term (current) use of aspirin: Secondary | ICD-10-CM | POA: Insufficient documentation

## 2015-02-11 DIAGNOSIS — J449 Chronic obstructive pulmonary disease, unspecified: Secondary | ICD-10-CM | POA: Insufficient documentation

## 2015-02-11 DIAGNOSIS — F418 Other specified anxiety disorders: Secondary | ICD-10-CM | POA: Insufficient documentation

## 2015-02-11 DIAGNOSIS — Z7951 Long term (current) use of inhaled steroids: Secondary | ICD-10-CM | POA: Insufficient documentation

## 2015-02-11 DIAGNOSIS — Z87891 Personal history of nicotine dependence: Secondary | ICD-10-CM | POA: Diagnosis not present

## 2015-02-11 DIAGNOSIS — H2511 Age-related nuclear cataract, right eye: Secondary | ICD-10-CM | POA: Diagnosis not present

## 2015-02-11 DIAGNOSIS — H269 Unspecified cataract: Secondary | ICD-10-CM | POA: Diagnosis not present

## 2015-02-11 HISTORY — PX: CATARACT EXTRACTION W/PHACO: SHX586

## 2015-02-11 SURGERY — PHACOEMULSIFICATION, CATARACT, WITH IOL INSERTION
Anesthesia: Monitor Anesthesia Care | Site: Eye | Laterality: Right

## 2015-02-11 MED ORDER — LIDOCAINE HCL (PF) 1 % IJ SOLN
INTRAMUSCULAR | Status: DC | PRN
  Administered 2015-02-11: .3 mL

## 2015-02-11 MED ORDER — PHENYLEPHRINE HCL 2.5 % OP SOLN
1.0000 [drp] | OPHTHALMIC | Status: AC
  Administered 2015-02-11 (×3): 1 [drp] via OPHTHALMIC

## 2015-02-11 MED ORDER — NEOMYCIN-POLYMYXIN-DEXAMETH 3.5-10000-0.1 OP SUSP
OPHTHALMIC | Status: DC | PRN
  Administered 2015-02-11: 2 [drp] via OPHTHALMIC

## 2015-02-11 MED ORDER — MIDAZOLAM HCL 2 MG/2ML IJ SOLN
INTRAMUSCULAR | Status: AC
  Filled 2015-02-11: qty 2

## 2015-02-11 MED ORDER — MIDAZOLAM HCL 2 MG/2ML IJ SOLN
1.0000 mg | INTRAMUSCULAR | Status: DC | PRN
  Administered 2015-02-11 (×3): 2 mg via INTRAVENOUS
  Filled 2015-02-11 (×2): qty 2

## 2015-02-11 MED ORDER — POVIDONE-IODINE 5 % OP SOLN
OPHTHALMIC | Status: DC | PRN
  Administered 2015-02-11: 1 via OPHTHALMIC

## 2015-02-11 MED ORDER — CYCLOPENTOLATE-PHENYLEPHRINE 0.2-1 % OP SOLN
1.0000 [drp] | OPHTHALMIC | Status: AC
  Administered 2015-02-11 (×3): 1 [drp] via OPHTHALMIC

## 2015-02-11 MED ORDER — LIDOCAINE HCL 3.5 % OP GEL
1.0000 "application " | Freq: Once | OPHTHALMIC | Status: AC
  Administered 2015-02-11: 1 via OPHTHALMIC

## 2015-02-11 MED ORDER — LACTATED RINGERS IV SOLN
INTRAVENOUS | Status: DC
  Administered 2015-02-11: 11:00:00 via INTRAVENOUS

## 2015-02-11 MED ORDER — EPINEPHRINE HCL 1 MG/ML IJ SOLN
INTRAMUSCULAR | Status: AC
  Filled 2015-02-11: qty 1

## 2015-02-11 MED ORDER — BSS IO SOLN
INTRAOCULAR | Status: DC | PRN
  Administered 2015-02-11: 500 mL

## 2015-02-11 MED ORDER — TETRACAINE HCL 0.5 % OP SOLN
1.0000 [drp] | OPHTHALMIC | Status: AC
  Administered 2015-02-11 (×3): 1 [drp] via OPHTHALMIC

## 2015-02-11 MED ORDER — PROVISC 10 MG/ML IO SOLN
INTRAOCULAR | Status: DC | PRN
  Administered 2015-02-11: 0.85 mL via INTRAOCULAR

## 2015-02-11 MED ORDER — BSS IO SOLN
INTRAOCULAR | Status: DC | PRN
  Administered 2015-02-11: 15 mL

## 2015-02-11 SURGICAL SUPPLY — 34 items
CAPSULAR TENSION RING-AMO (OPHTHALMIC RELATED) IMPLANT
CLOTH BEACON ORANGE TIMEOUT ST (SAFETY) ×3 IMPLANT
EYE SHIELD UNIVERSAL CLEAR (GAUZE/BANDAGES/DRESSINGS) ×3 IMPLANT
GLOVE BIO SURGEON STRL SZ 6.5 (GLOVE) IMPLANT
GLOVE BIO SURGEONS STRL SZ 6.5 (GLOVE)
GLOVE BIOGEL PI IND STRL 6.5 (GLOVE) ×1 IMPLANT
GLOVE BIOGEL PI IND STRL 7.0 (GLOVE) ×1 IMPLANT
GLOVE BIOGEL PI IND STRL 7.5 (GLOVE) IMPLANT
GLOVE BIOGEL PI INDICATOR 6.5 (GLOVE) ×2
GLOVE BIOGEL PI INDICATOR 7.0 (GLOVE) ×2
GLOVE BIOGEL PI INDICATOR 7.5 (GLOVE)
GLOVE ECLIPSE 6.5 STRL STRAW (GLOVE) IMPLANT
GLOVE ECLIPSE 7.0 STRL STRAW (GLOVE) IMPLANT
GLOVE ECLIPSE 7.5 STRL STRAW (GLOVE) IMPLANT
GLOVE EXAM NITRILE LRG STRL (GLOVE) IMPLANT
GLOVE EXAM NITRILE MD LF STRL (GLOVE) IMPLANT
GLOVE SKINSENSE NS SZ6.5 (GLOVE)
GLOVE SKINSENSE NS SZ7.0 (GLOVE)
GLOVE SKINSENSE STRL SZ6.5 (GLOVE) IMPLANT
GLOVE SKINSENSE STRL SZ7.0 (GLOVE) IMPLANT
KIT VITRECTOMY (OPHTHALMIC RELATED) IMPLANT
PAD ARMBOARD 7.5X6 YLW CONV (MISCELLANEOUS) ×3 IMPLANT
PROC W NO LENS (INTRAOCULAR LENS)
PROC W SPEC LENS (INTRAOCULAR LENS)
PROCESS W NO LENS (INTRAOCULAR LENS) IMPLANT
PROCESS W SPEC LENS (INTRAOCULAR LENS) IMPLANT
RETRACTOR IRIS SIGHTPATH (OPHTHALMIC RELATED) IMPLANT
RING MALYGIN (MISCELLANEOUS) IMPLANT
SIGHTPATH CAT PROC W REG LENS (Ophthalmic Related) ×3 IMPLANT
SYRINGE LUER LOK 1CC (MISCELLANEOUS) ×3 IMPLANT
TAPE SURG TRANSPORE 1 IN (GAUZE/BANDAGES/DRESSINGS) ×1 IMPLANT
TAPE SURGICAL TRANSPORE 1 IN (GAUZE/BANDAGES/DRESSINGS) ×2
VISCOELASTIC ADDITIONAL (OPHTHALMIC RELATED) IMPLANT
WATER STERILE IRR 250ML POUR (IV SOLUTION) ×3 IMPLANT

## 2015-02-11 NOTE — Anesthesia Preprocedure Evaluation (Signed)
Anesthesia Evaluation  Patient identified by MRN, date of birth, ID band Patient awake    Reviewed: Allergy & Precautions, NPO status , Patient's Chart, lab work & pertinent test results  Airway Mallampati: II  TM Distance: >3 FB Neck ROM: Full    Dental  (+) Edentulous Upper, Edentulous Lower, Upper Dentures   Pulmonary COPD,  COPD inhaler, former smoker,    Pulmonary exam normal        Cardiovascular Normal cardiovascular exam     Neuro/Psych Anxiety Depression    GI/Hepatic   Endo/Other    Renal/GU      Musculoskeletal   Abdominal Normal abdominal exam  (+)   Peds  Hematology   Anesthesia Other Findings   Reproductive/Obstetrics                             Anesthesia Physical Anesthesia Plan  ASA: III  Anesthesia Plan: MAC   Post-op Pain Management:    Induction: Intravenous  Airway Management Planned: Nasal Cannula  Additional Equipment:   Intra-op Plan:   Post-operative Plan:   Informed Consent: I have reviewed the patients History and Physical, chart, labs and discussed the procedure including the risks, benefits and alternatives for the proposed anesthesia with the patient or authorized representative who has indicated his/her understanding and acceptance.     Plan Discussed with: CRNA  Anesthesia Plan Comments:         Anesthesia Quick Evaluation

## 2015-02-11 NOTE — Discharge Instructions (Signed)
PATIENT INSTRUCTIONS POST-ANESTHESIA  IMMEDIATELY FOLLOWING SURGERY:  Do not drive or operate machinery for the first twenty four hours after surgery.  Do not make any important decisions for twenty four hours after surgery or while taking narcotic pain medications or sedatives.  If you develop intractable nausea and vomiting or a severe headache please notify your doctor immediately.  FOLLOW-UP:  Please make an appointment with your surgeon as instructed. You do not need to follow up with anesthesia unless specifically instructed to do so.  WOUND CARE INSTRUCTIONS (if applicable):  Keep a dry clean dressing on the anesthesia/puncture wound site if there is drainage.  Once the wound has quit draining you may leave it open to air.  Generally you should leave the bandage intact for twenty four hours unless there is drainage.  If the epidural site drains for more than 36-48 hours please call the anesthesia department.  QUESTIONS?:  Please feel free to call your physician or the hospital operator if you have any questions, and they will be happy to assist you.     Monitored Anesthesia Care Monitored anesthesia care is an anesthesia service for a medical procedure. Anesthesia is the loss of the ability to feel pain. It is produced by medicines called anesthetics. It may affect a small area of your body (local anesthesia), a large area of your body (regional anesthesia), or your entire body (general anesthesia). The need for monitored anesthesia care depends your procedure, your condition, and the potential need for regional or general anesthesia. It is often provided during procedures where:   General anesthesia may be needed if there are complications. This is because you need special care when you are under general anesthesia.   You will be under local or regional anesthesia. This is so that you are able to have higher levels of anesthesia if needed.   You will receive calming medicines (sedatives).  This is especially the case if sedatives are given to put you in a semi-conscious state of relaxation (deep sedation). This is because the amount of sedative needed to produce this state can be hard to predict. Too much of a sedative can produce general anesthesia. Monitored anesthesia care is performed by one or more health care providers who have special training in all types of anesthesia. You will need to meet with these health care providers before your procedure. During this meeting, they will ask you about your medical history. They will also give you instructions to follow. (For example, you will need to stop eating and drinking before your procedure. You may also need to stop or change medicines you are taking.) During your procedure, your health care providers will stay with you. They will:   Watch your condition. This includes watching your blood pressure, breathing, and level of pain.   Diagnose and treat problems that occur.   Give medicines if they are needed. These may include calming medicines (sedatives) and anesthetics.   Make sure you are comfortable.  Having monitored anesthesia care does not necessarily mean that you will be under anesthesia. It does mean that your health care providers will be able to manage anesthesia if you need it or if it occurs. It also means that you will be able to have a different type of anesthesia than you are having if you need it. When your procedure is complete, your health care providers will continue to watch your condition. They will make sure any medicines wear off before you are allowed to go home.  This information is not intended to replace advice given to you by your health care provider. Make sure you discuss any questions you have with your health care provider.   Document Released: 09/28/2004 Document Revised: 01/23/2014 Document Reviewed: 02/14/2012 Elsevier Interactive Patient Education [employer].

## 2015-02-11 NOTE — Anesthesia Postprocedure Evaluation (Signed)
Anesthesia Post Note  Patient: Jessica Gibbs  Procedure(s) Performed: Procedure(s) (LRB): CATARACT EXTRACTION PHACO AND INTRAOCULAR LENS PLACEMENT RIGHT EYE (Right)  Patient location during evaluation: Short Stay Anesthesia Type: MAC Level of consciousness: awake and alert and oriented Pain management: pain level controlled Vital Signs Assessment: post-procedure vital signs reviewed and stable Respiratory status: spontaneous breathing and respiratory function stable Cardiovascular status: stable Postop Assessment: no signs of nausea or vomiting Anesthetic complications: no    Last Vitals:  Filed Vitals:   02/11/15 1110 02/11/15 1115  BP: 118/56 122/60  Pulse:    Temp:    Resp: 10 10    Last Pain: There were no vitals filed for this visit.               ADAMS, AMY A

## 2015-02-11 NOTE — Anesthesia Procedure Notes (Signed)
Procedure Name: MAC Date/Time: 02/11/2015 11:22 AM Performed by: Andree Elk, Niomi Valent A Pre-anesthesia Checklist: Patient identified, Timeout performed, Emergency Drugs available, Suction available and Patient being monitored Oxygen Delivery Method: Nasal cannula

## 2015-02-11 NOTE — H&P (Signed)
I have reviewed the H&P, the patient was re-examined, and I have identified no interval changes in medical condition and plan of care since the history and physical of record  

## 2015-02-11 NOTE — Transfer of Care (Signed)
Immediate Anesthesia Transfer of Care Note  Patient: Jessica Gibbs  Procedure(s) Performed: Procedure(s) with comments: CATARACT EXTRACTION PHACO AND INTRAOCULAR LENS PLACEMENT RIGHT EYE (Right) - CDE 9.62  Patient Location: Short Stay  Anesthesia Type:MAC  Level of Consciousness: awake, alert , oriented and patient cooperative  Airway & Oxygen Therapy: Patient Spontanous Breathing  Post-op Assessment: Report given to RN and Post -op Vital signs reviewed and stable  Post vital signs: Reviewed and stable  Last Vitals:  Filed Vitals:   02/11/15 1110 02/11/15 1115  BP: 118/56 122/60  Pulse:    Temp:    Resp: 10 10    Complications: No apparent anesthesia complications

## 2015-02-11 NOTE — Op Note (Signed)
Date of Admission: 02/11/2015  Date of Surgery: 02/11/2015   Pre-Op Dx: Cataract Right Eye  Post-Op Dx: Senile Nuclear Cataract Right  Eye,  Dx Code H25.11  Surgeon: Tonny Branch, M.D.  Assistants: None  Anesthesia: Topical with MAC  Indications: Painless, progressive loss of vision with compromise of daily activities.  Surgery: Cataract Extraction with Intraocular lens Implant Right Eye  Discription: The patient had dilating drops and viscous lidocaine placed into the Right eye in the pre-op holding area. After transfer to the operating room, a time out was performed. The patient was then prepped and draped. Beginning with a 87 degree blade a paracentesis port was made at the surgeon's 2 o'clock position. The anterior chamber was then filled with 1% non-preserved lidocaine. This was followed by filling the anterior chamber with Provisc.  A 2.52m keratome blade was used to make a clear corneal incision at the temporal limbus.  A bent cystatome needle was used to create a continuous tear capsulotomy. Hydrodissection was performed with balanced salt solution on a Fine canula. The lens nucleus was then removed using the phacoemulsification handpiece. Residual cortex was removed with the I&A handpiece. The anterior chamber and capsular bag were refilled with Provisc. A posterior chamber intraocular lens was placed into the capsular bag with it's injector. The implant was positioned with the Kuglan hook. The Provisc was then removed from the anterior chamber and capsular bag with the I&A handpiece. Stromal hydration of the main incision and paracentesis port was performed with BSS on a Fine canula. The wounds were tested for leak which was negative. The patient tolerated the procedure well. There were no operative complications. The patient was then transferred to the recovery room in stable condition.  Complications: None  Specimen: None  EBL: None  Prosthetic device: Hoya iSert 250, power 19.0 D,  SN: [id number].

## 2015-02-12 ENCOUNTER — Encounter (HOSPITAL_COMMUNITY): Payer: Self-pay | Admitting: Ophthalmology

## 2015-02-15 DIAGNOSIS — Z961 Presence of intraocular lens: Secondary | ICD-10-CM | POA: Diagnosis not present

## 2015-02-15 DIAGNOSIS — H5231 Anisometropia: Secondary | ICD-10-CM | POA: Diagnosis not present

## 2015-02-15 DIAGNOSIS — H2512 Age-related nuclear cataract, left eye: Secondary | ICD-10-CM | POA: Diagnosis not present

## 2015-02-15 DIAGNOSIS — H5202 Hypermetropia, left eye: Secondary | ICD-10-CM | POA: Diagnosis not present

## 2015-02-16 DIAGNOSIS — H2512 Age-related nuclear cataract, left eye: Secondary | ICD-10-CM | POA: Diagnosis not present

## 2015-02-25 ENCOUNTER — Encounter (HOSPITAL_COMMUNITY): Payer: Self-pay

## 2015-02-25 ENCOUNTER — Encounter (HOSPITAL_COMMUNITY)
Admission: RE | Admit: 2015-02-25 | Discharge: 2015-02-25 | Disposition: A | Discharge status: Home / Self Care | Payer: Medicare Other | Source: Ambulatory Visit | Attending: Ophthalmology | Admitting: Ophthalmology

## 2015-03-01 ENCOUNTER — Encounter (HOSPITAL_COMMUNITY): Payer: Self-pay | Admitting: *Deleted

## 2015-03-01 ENCOUNTER — Ambulatory Visit (HOSPITAL_COMMUNITY): Payer: Medicare Other | Admitting: Anesthesiology

## 2015-03-01 ENCOUNTER — Encounter (HOSPITAL_COMMUNITY)
Admission: RE | Disposition: A | Discharge status: Home / Self Care | Payer: Self-pay | Source: Ambulatory Visit | Attending: Ophthalmology

## 2015-03-01 ENCOUNTER — Ambulatory Visit (HOSPITAL_COMMUNITY)
Admission: RE | Admit: 2015-03-01 | Discharge: 2015-03-01 | Disposition: A | Discharge status: Home / Self Care | Payer: Medicare Other | Source: Ambulatory Visit | Attending: Ophthalmology | Admitting: Ophthalmology

## 2015-03-01 DIAGNOSIS — H269 Unspecified cataract: Secondary | ICD-10-CM | POA: Diagnosis not present

## 2015-03-01 DIAGNOSIS — K219 Gastro-esophageal reflux disease without esophagitis: Secondary | ICD-10-CM | POA: Diagnosis not present

## 2015-03-01 DIAGNOSIS — Z87891 Personal history of nicotine dependence: Secondary | ICD-10-CM | POA: Insufficient documentation

## 2015-03-01 DIAGNOSIS — H2512 Age-related nuclear cataract, left eye: Secondary | ICD-10-CM | POA: Diagnosis present

## 2015-03-01 DIAGNOSIS — Z79899 Other long term (current) drug therapy: Secondary | ICD-10-CM | POA: Insufficient documentation

## 2015-03-01 DIAGNOSIS — J449 Chronic obstructive pulmonary disease, unspecified: Secondary | ICD-10-CM | POA: Insufficient documentation

## 2015-03-01 DIAGNOSIS — F418 Other specified anxiety disorders: Secondary | ICD-10-CM | POA: Diagnosis not present

## 2015-03-01 DIAGNOSIS — N289 Disorder of kidney and ureter, unspecified: Secondary | ICD-10-CM | POA: Insufficient documentation

## 2015-03-01 DIAGNOSIS — Z7982 Long term (current) use of aspirin: Secondary | ICD-10-CM | POA: Diagnosis not present

## 2015-03-01 HISTORY — PX: CATARACT EXTRACTION W/PHACO: SHX586

## 2015-03-01 SURGERY — PHACOEMULSIFICATION, CATARACT, WITH IOL INSERTION
Anesthesia: Monitor Anesthesia Care | Site: Eye | Laterality: Left

## 2015-03-01 MED ORDER — LIDOCAINE HCL 3.5 % OP GEL
1.0000 "application " | Freq: Once | OPHTHALMIC | Status: AC
  Administered 2015-03-01: 1 via OPHTHALMIC

## 2015-03-01 MED ORDER — PHENYLEPHRINE HCL 2.5 % OP SOLN
1.0000 [drp] | Freq: Once | OPHTHALMIC | Status: AC
  Administered 2015-03-01: 1 [drp] via OPHTHALMIC

## 2015-03-01 MED ORDER — BSS IO SOLN
INTRAOCULAR | Status: DC | PRN
  Administered 2015-03-01: 15 mL

## 2015-03-01 MED ORDER — NEOMYCIN-POLYMYXIN-DEXAMETH 3.5-10000-0.1 OP SUSP
OPHTHALMIC | Status: DC | PRN
  Administered 2015-03-01: 2 [drp] via OPHTHALMIC

## 2015-03-01 MED ORDER — LIDOCAINE HCL (PF) 1 % IJ SOLN
INTRAMUSCULAR | Status: DC | PRN
  Administered 2015-03-01: .5 mL

## 2015-03-01 MED ORDER — MIDAZOLAM HCL 2 MG/2ML IJ SOLN
INTRAMUSCULAR | Status: AC
  Filled 2015-03-01: qty 2

## 2015-03-01 MED ORDER — POVIDONE-IODINE 5 % OP SOLN
OPHTHALMIC | Status: DC | PRN
  Administered 2015-03-01: 1 via OPHTHALMIC

## 2015-03-01 MED ORDER — PROVISC 10 MG/ML IO SOLN
INTRAOCULAR | Status: DC | PRN
  Administered 2015-03-01: 0.85 mL via INTRAOCULAR

## 2015-03-01 MED ORDER — EPINEPHRINE HCL 1 MG/ML IJ SOLN
INTRAOCULAR | Status: DC | PRN
  Administered 2015-03-01: 500 mL

## 2015-03-01 MED ORDER — PHENYLEPHRINE HCL 2.5 % OP SOLN
1.0000 [drp] | Freq: Two times a day (BID) | OPHTHALMIC | Status: AC
  Administered 2015-03-01 (×2): 1 [drp] via OPHTHALMIC

## 2015-03-01 MED ORDER — EPINEPHRINE HCL 1 MG/ML IJ SOLN
INTRAMUSCULAR | Status: AC
  Filled 2015-03-01: qty 1

## 2015-03-01 MED ORDER — LACTATED RINGERS IV SOLN
INTRAVENOUS | Status: DC
  Administered 2015-03-01: 13:00:00 via INTRAVENOUS

## 2015-03-01 MED ORDER — CYCLOPENTOLATE-PHENYLEPHRINE 0.2-1 % OP SOLN
1.0000 [drp] | OPHTHALMIC | Status: AC
  Administered 2015-03-01 (×3): 1 [drp] via OPHTHALMIC

## 2015-03-01 MED ORDER — MIDAZOLAM HCL 2 MG/2ML IJ SOLN
1.0000 mg | INTRAMUSCULAR | Status: DC | PRN
  Administered 2015-03-01: 2 mg via INTRAVENOUS

## 2015-03-01 MED ORDER — TETRACAINE HCL 0.5 % OP SOLN
1.0000 [drp] | OPHTHALMIC | Status: AC
  Administered 2015-03-01 (×3): 1 [drp] via OPHTHALMIC

## 2015-03-01 SURGICAL SUPPLY — 34 items
CAPSULAR TENSION RING-AMO (OPHTHALMIC RELATED) IMPLANT
CLOTH BEACON ORANGE TIMEOUT ST (SAFETY) ×3 IMPLANT
EYE SHIELD UNIVERSAL CLEAR (GAUZE/BANDAGES/DRESSINGS) ×3 IMPLANT
GLOVE BIO SURGEON STRL SZ 6.5 (GLOVE) IMPLANT
GLOVE BIO SURGEONS STRL SZ 6.5 (GLOVE)
GLOVE BIOGEL PI IND STRL 6.5 (GLOVE) ×1 IMPLANT
GLOVE BIOGEL PI IND STRL 7.0 (GLOVE) ×1 IMPLANT
GLOVE BIOGEL PI IND STRL 7.5 (GLOVE) IMPLANT
GLOVE BIOGEL PI INDICATOR 6.5 (GLOVE) ×2
GLOVE BIOGEL PI INDICATOR 7.0 (GLOVE) ×2
GLOVE BIOGEL PI INDICATOR 7.5 (GLOVE)
GLOVE ECLIPSE 6.5 STRL STRAW (GLOVE) IMPLANT
GLOVE ECLIPSE 7.0 STRL STRAW (GLOVE) IMPLANT
GLOVE ECLIPSE 7.5 STRL STRAW (GLOVE) IMPLANT
GLOVE EXAM NITRILE LRG STRL (GLOVE) IMPLANT
GLOVE EXAM NITRILE MD LF STRL (GLOVE) IMPLANT
GLOVE SKINSENSE NS SZ6.5 (GLOVE)
GLOVE SKINSENSE NS SZ7.0 (GLOVE)
GLOVE SKINSENSE STRL SZ6.5 (GLOVE) IMPLANT
GLOVE SKINSENSE STRL SZ7.0 (GLOVE) IMPLANT
KIT VITRECTOMY (OPHTHALMIC RELATED) IMPLANT
PAD ARMBOARD 7.5X6 YLW CONV (MISCELLANEOUS) ×3 IMPLANT
PROC W NO LENS (INTRAOCULAR LENS)
PROC W SPEC LENS (INTRAOCULAR LENS)
PROCESS W NO LENS (INTRAOCULAR LENS) IMPLANT
PROCESS W SPEC LENS (INTRAOCULAR LENS) IMPLANT
RETRACTOR IRIS SIGHTPATH (OPHTHALMIC RELATED) IMPLANT
RING MALYGIN (MISCELLANEOUS) IMPLANT
SIGHTPATH CAT PROC W REG LENS (Ophthalmic Related) ×3 IMPLANT
SYRINGE LUER LOK 1CC (MISCELLANEOUS) ×3 IMPLANT
TAPE SURG TRANSPORE 1 IN (GAUZE/BANDAGES/DRESSINGS) ×1 IMPLANT
TAPE SURGICAL TRANSPORE 1 IN (GAUZE/BANDAGES/DRESSINGS) ×2
VISCOELASTIC ADDITIONAL (OPHTHALMIC RELATED) IMPLANT
WATER STERILE IRR 250ML POUR (IV SOLUTION) ×3 IMPLANT

## 2015-03-01 NOTE — Anesthesia Postprocedure Evaluation (Signed)
Anesthesia Post Note  Patient: Jessica Gibbs  Procedure(s) Performed: Procedure(s) (LRB): CATARACT EXTRACTION PHACO AND INTRAOCULAR LENS PLACEMENT (IOC) (Left)  Patient location during evaluation: Short Stay Anesthesia Type: MAC Level of consciousness: awake and alert and oriented Pain management: pain level controlled Vital Signs Assessment: post-procedure vital signs reviewed and stable Respiratory status: spontaneous breathing and respiratory function stable Cardiovascular status: stable Postop Assessment: no signs of nausea or vomiting Anesthetic complications: no    Last Vitals:  Filed Vitals:   03/01/15 1327 03/01/15 1330  BP:  126/63  Temp: 36.8 C   Resp:  11    Last Pain: There were no vitals filed for this visit.               ADAMS, AMY A

## 2015-03-01 NOTE — Op Note (Signed)
Date of Admission: 03/01/2015  Date of Surgery: 03/01/2015   Pre-Op Dx: Cataract Left Eye  Post-Op Dx: Senile Nuclear Cataract Left  Eye,  Dx Code H25.12  Surgeon: Tonny Branch, M.D.  Assistants: None  Anesthesia: Topical with MAC  Indications: Painless, progressive loss of vision with compromise of daily activities.  Surgery: Cataract Extraction with Intraocular lens Implant Left Eye  Discription: The patient had dilating drops and viscous lidocaine placed into the Left eye in the pre-op holding area. After transfer to the operating room, a time out was performed. The patient was then prepped and draped. Beginning with a 15 degree blade a paracentesis port was made at the surgeon's 2 o'clock position. The anterior chamber was then filled with 1% non-preserved lidocaine. This was followed by filling the anterior chamber with Provisc.  A 2.57m keratome blade was used to make a clear corneal incision at the temporal limbus.  A bent cystatome needle was used to create a continuous tear capsulotomy. Hydrodissection was performed with balanced salt solution on a Fine canula. The lens nucleus was then removed using the phacoemulsification handpiece. Residual cortex was removed with the I&A handpiece. The anterior chamber and capsular bag were refilled with Provisc. A posterior chamber intraocular lens was placed into the capsular bag with it's injector. The implant was positioned with the Kuglan hook. The Provisc was then removed from the anterior chamber and capsular bag with the I&A handpiece. Stromal hydration of the main incision and paracentesis port was performed with BSS on a Fine canula. The wounds were tested for leak which was negative. The patient tolerated the procedure well. There were no operative complications. The patient was then transferred to the recovery room in stable condition.  Complications: None  Specimen: None  EBL: None  Prosthetic device: Hoya iSert 250, power 18.5 D, SN  [id number]

## 2015-03-01 NOTE — Transfer of Care (Signed)
Immediate Anesthesia Transfer of Care Note  Patient: Jessica Gibbs  Procedure(s) Performed: Procedure(s) with comments: CATARACT EXTRACTION PHACO AND INTRAOCULAR LENS PLACEMENT (IOC) (Left) - CDE 10.17  Patient Location: Short Stay  Anesthesia Type:MAC  Level of Consciousness: awake, alert , oriented and patient cooperative  Airway & Oxygen Therapy: Patient Spontanous Breathing  Post-op Assessment: Report given to RN and Post -op Vital signs reviewed and stable  Post vital signs: Reviewed and stable  Last Vitals:  Filed Vitals:   03/01/15 1327 03/01/15 1330  BP:  126/63  Temp: 36.8 C   Resp:  11    Complications: No apparent anesthesia complications

## 2015-03-01 NOTE — Anesthesia Procedure Notes (Signed)
Procedure Name: MAC Date/Time: 03/01/2015 1:37 PM Performed by: Andree Elk, Shaune Westfall A Pre-anesthesia Checklist: Patient identified, Timeout performed, Emergency Drugs available, Suction available and Patient being monitored Oxygen Delivery Method: Nasal cannula

## 2015-03-01 NOTE — Anesthesia Preprocedure Evaluation (Signed)
Anesthesia Evaluation  Patient identified by MRN, date of birth, ID band Patient awake    Reviewed: Allergy & Precautions, NPO status , Patient's Chart, lab work & pertinent test results  Airway Mallampati: III  TM Distance: >3 FB     Dental  (+) Edentulous Upper, Edentulous Lower   Pulmonary COPD,  COPD inhaler, former smoker,    Pulmonary exam normal        Cardiovascular Normal cardiovascular exam     Neuro/Psych  Headaches, Anxiety Depression    GI/Hepatic GERD  Medicated and Controlled,  Endo/Other    Renal/GU Renal InsufficiencyRenal disease     Musculoskeletal   Abdominal Normal abdominal exam  (+)   Peds  Hematology   Anesthesia Other Findings   Reproductive/Obstetrics                             Anesthesia Physical Anesthesia Plan  ASA: III  Anesthesia Plan: MAC   Post-op Pain Management:    Induction:   Airway Management Planned: Nasal Cannula  Additional Equipment:   Intra-op Plan:   Post-operative Plan:   Informed Consent: I have reviewed the patients History and Physical, chart, labs and discussed the procedure including the risks, benefits and alternatives for the proposed anesthesia with the patient or authorized representative who has indicated his/her understanding and acceptance.     Plan Discussed with: CRNA  Anesthesia Plan Comments:         Anesthesia Quick Evaluation

## 2015-03-01 NOTE — Discharge Instructions (Signed)

## 2015-03-01 NOTE — H&P (Signed)
I have reviewed the H&P, the patient was re-examined, and I have identified no interval changes in medical condition and plan of care since the history and physical of record  

## 2015-03-02 ENCOUNTER — Encounter (HOSPITAL_COMMUNITY): Payer: Self-pay | Admitting: Ophthalmology

## 2015-03-26 ENCOUNTER — Emergency Department (HOSPITAL_COMMUNITY): Payer: Medicare Other

## 2015-03-26 ENCOUNTER — Emergency Department (HOSPITAL_COMMUNITY)
Admission: EM | Admit: 2015-03-26 | Discharge: 2015-03-26 | Disposition: A | Discharge status: Home / Self Care | Payer: Medicare Other | Attending: Emergency Medicine | Admitting: Emergency Medicine

## 2015-03-26 ENCOUNTER — Encounter (HOSPITAL_COMMUNITY): Payer: Self-pay | Admitting: Emergency Medicine

## 2015-03-26 DIAGNOSIS — R110 Nausea: Secondary | ICD-10-CM | POA: Diagnosis not present

## 2015-03-26 DIAGNOSIS — Z87891 Personal history of nicotine dependence: Secondary | ICD-10-CM | POA: Diagnosis not present

## 2015-03-26 DIAGNOSIS — Z7982 Long term (current) use of aspirin: Secondary | ICD-10-CM | POA: Diagnosis not present

## 2015-03-26 DIAGNOSIS — R51 Headache: Secondary | ICD-10-CM | POA: Insufficient documentation

## 2015-03-26 DIAGNOSIS — Z79899 Other long term (current) drug therapy: Secondary | ICD-10-CM | POA: Diagnosis not present

## 2015-03-26 DIAGNOSIS — R519 Headache, unspecified: Secondary | ICD-10-CM

## 2015-03-26 DIAGNOSIS — R5383 Other fatigue: Secondary | ICD-10-CM | POA: Insufficient documentation

## 2015-03-26 LAB — COMPREHENSIVE METABOLIC PANEL
ALT: 15 U/L (ref 14–54)
AST: 18 U/L (ref 15–41)
Albumin: 3.8 g/dL (ref 3.5–5.0)
Alkaline Phosphatase: 64 U/L (ref 38–126)
Anion gap: 6 (ref 5–15)
BUN: 16 mg/dL (ref 6–20)
CHLORIDE: 105 mmol/L (ref 101–111)
CO2: 27 mmol/L (ref 22–32)
Calcium: 9 mg/dL (ref 8.9–10.3)
Creatinine, Ser: 0.81 mg/dL (ref 0.44–1.00)
Glucose, Bld: 106 mg/dL — ABNORMAL HIGH (ref 65–99)
POTASSIUM: 3.9 mmol/L (ref 3.5–5.1)
SODIUM: 138 mmol/L (ref 135–145)
Total Bilirubin: 0.3 mg/dL (ref 0.3–1.2)
Total Protein: 6.9 g/dL (ref 6.5–8.1)

## 2015-03-26 LAB — CBC WITH DIFFERENTIAL/PLATELET
BASOS ABS: 0 10*3/uL (ref 0.0–0.1)
Basophils Relative: 1 %
EOS ABS: 0.3 10*3/uL (ref 0.0–0.7)
EOS PCT: 6 %
HCT: 38.9 % (ref 36.0–46.0)
Hemoglobin: 12.9 g/dL (ref 12.0–15.0)
LYMPHS ABS: 1.2 10*3/uL (ref 0.7–4.0)
LYMPHS PCT: 26 %
MCH: 29.9 pg (ref 26.0–34.0)
MCHC: 33.2 g/dL (ref 30.0–36.0)
MCV: 90.3 fL (ref 78.0–100.0)
MONO ABS: 0.5 10*3/uL (ref 0.1–1.0)
Monocytes Relative: 11 %
Neutro Abs: 2.6 10*3/uL (ref 1.7–7.7)
Neutrophils Relative %: 56 %
PLATELETS: 167 10*3/uL (ref 150–400)
RBC: 4.31 MIL/uL (ref 3.87–5.11)
RDW: 13.1 % (ref 11.5–15.5)
WBC: 4.6 10*3/uL (ref 4.0–10.5)

## 2015-03-26 LAB — TSH: TSH: 0.714 u[IU]/mL (ref 0.350–4.500)

## 2015-03-26 MED ORDER — PROMETHAZINE HCL 25 MG/ML IJ SOLN
12.5000 mg | Freq: Once | INTRAMUSCULAR | Status: AC
  Administered 2015-03-26: 12.5 mg via INTRAMUSCULAR
  Filled 2015-03-26: qty 1

## 2015-03-26 MED ORDER — KETOROLAC TROMETHAMINE 60 MG/2ML IM SOLN
30.0000 mg | Freq: Once | INTRAMUSCULAR | Status: AC
  Administered 2015-03-26: 30 mg via INTRAMUSCULAR
  Filled 2015-03-26: qty 2

## 2015-03-26 NOTE — ED Notes (Signed)
Denies any dizziness.  C/o headache for last 3-4 months.  Headache has been getting worst by increasing worst this week.  Headache never goes away.  Rates pain 10/10.  Denies any dizziness but forgetful at times, which is unusual.  Periods of nausea with movement. Having weakness ,she normally up and moving and working in yard.  Not having any problems eating or drinking.

## 2015-03-26 NOTE — ED Provider Notes (Signed)
CSN: [id number]     Arrival date & time 03/26/15  1213 History   First MD Initiated Contact with Patient 03/26/15 1630     Chief Complaint  Patient presents with  . Headache   Patient is a 75 y.o. female presenting with headaches. The history is provided by the patient.  Headache Associated symptoms: fatigue and nausea   Associated symptoms: no abdominal pain, no back pain, no diarrhea, no eye pain, no neck stiffness, no numbness, no vomiting and no weakness    patient is presented with a headache and feeling bad for somewhere between 3-4 months and a week or 2. Her's history has said both. States she started out with occasional headache. Denies weight loss. Denies numbness or weakness. States that she gets nausea when she exerts herself. No chest pain. No weight loss. States she saw her primary care doctor who will not do anything about it. States she is somewhat depressed about it. No nausea vomiting or diarrhea. No fevers or chills. No dysuria.  Past Medical History  Diagnosis Date  . Chronic back pain   . GERD (gastroesophageal reflux disease)   . Depression   . Anxiety   . Helicobacter pylori gastritis 2008  . Numbness    Past Surgical History  Procedure Laterality Date  . Esophagogastroduodenoscopy  07/28/2009    small HH/2 cm of tongue of salmom colored/? short Barrett's s/p dilationesophageal web, PATH: bening mildly inflamed GE junction mucosa c/.w reflux. negative Barrett's   . Esophagogastroduodenoscopy  06/21/06    small hiatal hernia/tongue salmon colored, PATH: SS  Barrett's. + H.PYLORI GASTRITIS  . Colonoscopy  06/21/06    normal  . Cholecystectomy  1987  . Back surgery  1997  . Abdominal hysterectomy  1981  . Appendectomy  1977  . Colonoscopy  11/25/01    internal hemorrhoids/otherwise normal  . Colonoscopy  11/24/97    mild colitis/hyperplastic polyps  . Esophagogastroduodenoscopy  07/08/07  . Colonoscopy  09/19/2011    Procedure: COLONOSCOPY;  Surgeon: Daneil Dolin,  MD;  Location: [facility];  Service: Endoscopy;  Laterality: N/A;  1:15  . Vaginal hysterectomy    . Cataract extraction w/phaco Right 02/11/2015    Procedure: CATARACT EXTRACTION PHACO AND INTRAOCULAR LENS PLACEMENT RIGHT EYE;  Surgeon: Tonny Branch, MD;  Location: [facility];  Service: Ophthalmology;  Laterality: Right;  CDE 9.62  . Cataract extraction w/phaco Left 03/01/2015    Procedure: CATARACT EXTRACTION PHACO AND INTRAOCULAR LENS PLACEMENT (IOC);  Surgeon: Tonny Branch, MD;  Location: [facility];  Service: Ophthalmology;  Laterality: Left;  CDE 10.17   Family History  Problem Relation Age of Onset  . Colon cancer Neg Hx   . Heart Problems Mother    Social History  Substance Use Topics  . Smoking status: Former Smoker    Types: Cigarettes    Quit date: 01/20/2004  . Smokeless tobacco: Never Used     Comment: Quit 2004  . Alcohol Use: No   OB History    No data available     Review of Systems  Constitutional: Positive for fatigue. Negative for activity change and appetite change.  Eyes: Negative for pain.  Respiratory: Negative for chest tightness and shortness of breath.   Cardiovascular: Negative for chest pain and leg swelling.  Gastrointestinal: Positive for nausea. Negative for vomiting, abdominal pain and diarrhea.  Genitourinary: Negative for flank pain.  Musculoskeletal: Negative for back pain and neck stiffness.  Skin: Negative for rash.  Neurological: Positive for  headaches. Negative for weakness and numbness.  Psychiatric/Behavioral: Negative for behavioral problems.      Allergies  Morphine and related  Home Medications   Prior to Admission medications   Medication Sig Start Date End Date Taking? Authorizing Provider  ALPRAZolam Duanne Moron) 1 MG tablet Take 1 mg by mouth at bedtime as needed. Sleep 08/21/11  Yes Historical Provider, MD  aspirin 81 MG tablet Take 81 mg by mouth every morning.    Yes Historical Provider, MD  citalopram (CELEXA) 40 MG tablet Take 40 mg  by mouth every evening.  08/21/11  Yes Historical Provider, MD  COMBIVENT 18-103 MCG/ACT inhaler Inhale 1 puff into the lungs 4 (four) times daily as needed. Shortness of Breath 06/08/11  Yes Historical Provider, MD  NONFORMULARY OR COMPOUNDED ITEM Apply 1-2 g topically 4 (four) times daily as needed. C-DOXEPIN/CYCLOBEN/GABAPENTIN/IBUPROFEN/LIDOCAINE 1.9-2-2-2-2.25% 03/10/15  Yes Historical Provider, MD  omeprazole (PRILOSEC) 20 MG capsule Take 20 mg by mouth 2 (two) times daily before a meal.  07/07/11  Yes Historical Provider, MD  oxyCODONE-acetaminophen (PERCOCET/ROXICET) 5-325 MG per tablet Take 0.5-1 tablets by mouth daily as needed for severe pain. Pain 08/11/11  Yes Historical Provider, MD   BP 137/60 mmHg  Pulse 55  Temp(Src) 98.9 F (37.2 C) (Oral)  Resp 16  Ht 6' (1.829 m)  Wt 185 lb (83.915 kg)  BMI 25.08 kg/m2  SpO2 100% Physical Exam  Constitutional: She appears well-developed.  HENT:  Head: Atraumatic.  Eyes: EOM are normal.  Neck: No thyromegaly present.  Cardiovascular: Normal rate.   Pulmonary/Chest: Effort normal.  Musculoskeletal: Normal range of motion.  Neurological: She is alert.  Skin: Skin is warm.    ED Course  Procedures (including critical care time) Labs Review Labs Reviewed  COMPREHENSIVE METABOLIC PANEL - Abnormal; Notable for the following:    Glucose, Bld 106 (*)    All other components within normal limits  CBC WITH DIFFERENTIAL/PLATELET  TSH    Imaging Review Ct Head Wo Contrast  03/26/2015  CLINICAL DATA:  Headache for 1 week.  Nausea with movement. EXAM: CT HEAD WITHOUT CONTRAST TECHNIQUE: Contiguous axial images were obtained from the base of the skull through the vertex without intravenous contrast. COMPARISON:  MRI brain 02/25/2013 FINDINGS: No acute cortical infarct, hemorrhage, or mass lesion is present. The ventricles are of normal size. No significant extra-axial fluid collection is evident. The paranasal sinuses and mastoid air cells are  clear. The calvarium is intact. Bilateral lens replacements are present. The globes and orbits are otherwise intact. No significant extracranial soft tissue lesions are present. IMPRESSION: Normal CT of the head for age. Electronically Signed   By: San Morelle M.D.   On: 03/26/2015 14:13   I have personally reviewed and evaluated these images and lab results as part of my medical decision-making.   EKG Interpretation None      MDM   Final diagnoses:  None    Patient with headache. Has been going for months. Head CT reassuring. Lab work reassuring. Also some fatigue and TSH is normal. Will discharge to follow-up with primary care doctor and possibly neurology.    Davonna Belling, MD 03/26/15 [phone]

## 2015-03-26 NOTE — Discharge Instructions (Signed)

## 2015-03-26 NOTE — ED Notes (Signed)
PT c/o headache x1 week with nausea upon movement. PT alert and oriented upon arrival to ED.

## 2015-03-29 DIAGNOSIS — E559 Vitamin D deficiency, unspecified: Secondary | ICD-10-CM | POA: Diagnosis not present

## 2015-03-29 DIAGNOSIS — Z1389 Encounter for screening for other disorder: Secondary | ICD-10-CM | POA: Diagnosis not present

## 2015-03-29 DIAGNOSIS — J440 Chronic obstructive pulmonary disease with acute lower respiratory infection: Secondary | ICD-10-CM | POA: Diagnosis not present

## 2015-03-29 DIAGNOSIS — R5383 Other fatigue: Secondary | ICD-10-CM | POA: Diagnosis not present

## 2015-03-29 DIAGNOSIS — Z0000 Encounter for general adult medical examination without abnormal findings: Secondary | ICD-10-CM | POA: Diagnosis not present

## 2015-04-07 DIAGNOSIS — M545 Low back pain: Secondary | ICD-10-CM | POA: Diagnosis not present

## 2015-04-07 DIAGNOSIS — Z885 Allergy status to narcotic agent status: Secondary | ICD-10-CM | POA: Diagnosis not present

## 2015-04-07 DIAGNOSIS — R079 Chest pain, unspecified: Secondary | ICD-10-CM | POA: Diagnosis not present

## 2015-04-07 DIAGNOSIS — R0602 Shortness of breath: Secondary | ICD-10-CM | POA: Diagnosis not present

## 2015-04-07 DIAGNOSIS — K449 Diaphragmatic hernia without obstruction or gangrene: Secondary | ICD-10-CM | POA: Diagnosis not present

## 2015-04-07 DIAGNOSIS — R0789 Other chest pain: Secondary | ICD-10-CM | POA: Diagnosis not present

## 2015-04-07 DIAGNOSIS — J441 Chronic obstructive pulmonary disease with (acute) exacerbation: Secondary | ICD-10-CM | POA: Diagnosis not present

## 2015-04-07 DIAGNOSIS — F418 Other specified anxiety disorders: Secondary | ICD-10-CM | POA: Diagnosis not present

## 2015-04-07 DIAGNOSIS — Z79899 Other long term (current) drug therapy: Secondary | ICD-10-CM | POA: Diagnosis not present

## 2015-04-07 DIAGNOSIS — N958 Other specified menopausal and perimenopausal disorders: Secondary | ICD-10-CM | POA: Diagnosis not present

## 2015-04-07 DIAGNOSIS — F17201 Nicotine dependence, unspecified, in remission: Secondary | ICD-10-CM | POA: Diagnosis not present

## 2015-04-07 DIAGNOSIS — Z7982 Long term (current) use of aspirin: Secondary | ICD-10-CM | POA: Diagnosis not present

## 2015-04-07 DIAGNOSIS — J449 Chronic obstructive pulmonary disease, unspecified: Secondary | ICD-10-CM | POA: Diagnosis not present

## 2015-04-08 DIAGNOSIS — K449 Diaphragmatic hernia without obstruction or gangrene: Secondary | ICD-10-CM | POA: Diagnosis not present

## 2015-04-08 DIAGNOSIS — F418 Other specified anxiety disorders: Secondary | ICD-10-CM | POA: Diagnosis not present

## 2015-04-08 DIAGNOSIS — M545 Low back pain: Secondary | ICD-10-CM | POA: Diagnosis not present

## 2015-04-08 DIAGNOSIS — R0789 Other chest pain: Secondary | ICD-10-CM | POA: Diagnosis not present

## 2015-04-08 DIAGNOSIS — J441 Chronic obstructive pulmonary disease with (acute) exacerbation: Secondary | ICD-10-CM | POA: Diagnosis not present

## 2015-04-14 DIAGNOSIS — I208 Other forms of angina pectoris: Secondary | ICD-10-CM | POA: Diagnosis not present

## 2015-04-14 DIAGNOSIS — J440 Chronic obstructive pulmonary disease with acute lower respiratory infection: Secondary | ICD-10-CM | POA: Diagnosis not present

## 2015-05-12 DIAGNOSIS — Z5181 Encounter for therapeutic drug level monitoring: Secondary | ICD-10-CM | POA: Diagnosis not present

## 2015-05-12 DIAGNOSIS — M545 Low back pain: Secondary | ICD-10-CM | POA: Diagnosis not present

## 2015-05-28 DIAGNOSIS — Z79899 Other long term (current) drug therapy: Secondary | ICD-10-CM | POA: Diagnosis not present

## 2015-05-28 DIAGNOSIS — F330 Major depressive disorder, recurrent, mild: Secondary | ICD-10-CM | POA: Diagnosis not present

## 2015-06-07 DIAGNOSIS — Z79899 Other long term (current) drug therapy: Secondary | ICD-10-CM | POA: Diagnosis not present

## 2015-06-07 DIAGNOSIS — F330 Major depressive disorder, recurrent, mild: Secondary | ICD-10-CM | POA: Diagnosis not present

## 2015-06-29 DIAGNOSIS — J440 Chronic obstructive pulmonary disease with acute lower respiratory infection: Secondary | ICD-10-CM | POA: Diagnosis not present

## 2015-08-31 DIAGNOSIS — Z79899 Other long term (current) drug therapy: Secondary | ICD-10-CM | POA: Diagnosis not present

## 2015-08-31 DIAGNOSIS — F330 Major depressive disorder, recurrent, mild: Secondary | ICD-10-CM | POA: Diagnosis not present

## 2015-09-27 DIAGNOSIS — M545 Low back pain: Secondary | ICD-10-CM | POA: Diagnosis not present

## 2015-09-27 DIAGNOSIS — Z79899 Other long term (current) drug therapy: Secondary | ICD-10-CM | POA: Diagnosis not present

## 2015-09-27 DIAGNOSIS — J440 Chronic obstructive pulmonary disease with acute lower respiratory infection: Secondary | ICD-10-CM | POA: Diagnosis not present

## 2015-09-27 DIAGNOSIS — K210 Gastro-esophageal reflux disease with esophagitis: Secondary | ICD-10-CM | POA: Diagnosis not present

## 2015-10-14 DIAGNOSIS — Z23 Encounter for immunization: Secondary | ICD-10-CM | POA: Diagnosis not present

## 2015-10-15 DIAGNOSIS — Z23 Encounter for immunization: Secondary | ICD-10-CM | POA: Diagnosis not present

## 2015-10-23 DIAGNOSIS — K219 Gastro-esophageal reflux disease without esophagitis: Secondary | ICD-10-CM | POA: Diagnosis not present

## 2015-10-23 DIAGNOSIS — Z87891 Personal history of nicotine dependence: Secondary | ICD-10-CM | POA: Diagnosis not present

## 2015-10-23 DIAGNOSIS — F329 Major depressive disorder, single episode, unspecified: Secondary | ICD-10-CM | POA: Diagnosis not present

## 2015-10-23 DIAGNOSIS — Z79899 Other long term (current) drug therapy: Secondary | ICD-10-CM | POA: Diagnosis not present

## 2015-10-23 DIAGNOSIS — N3001 Acute cystitis with hematuria: Secondary | ICD-10-CM | POA: Diagnosis not present

## 2015-10-23 DIAGNOSIS — Z7982 Long term (current) use of aspirin: Secondary | ICD-10-CM | POA: Diagnosis not present

## 2015-10-23 DIAGNOSIS — R310 Gross hematuria: Secondary | ICD-10-CM | POA: Diagnosis not present

## 2015-10-23 DIAGNOSIS — R079 Chest pain, unspecified: Secondary | ICD-10-CM | POA: Diagnosis not present

## 2015-11-01 DIAGNOSIS — R310 Gross hematuria: Secondary | ICD-10-CM | POA: Diagnosis not present

## 2015-11-10 DIAGNOSIS — H16223 Keratoconjunctivitis sicca, not specified as Sjogren's, bilateral: Secondary | ICD-10-CM | POA: Diagnosis not present

## 2015-11-10 DIAGNOSIS — Z961 Presence of intraocular lens: Secondary | ICD-10-CM | POA: Diagnosis not present

## 2015-11-16 DIAGNOSIS — R1011 Right upper quadrant pain: Secondary | ICD-10-CM | POA: Diagnosis not present

## 2015-11-16 DIAGNOSIS — Z7982 Long term (current) use of aspirin: Secondary | ICD-10-CM | POA: Diagnosis not present

## 2015-11-16 DIAGNOSIS — N390 Urinary tract infection, site not specified: Secondary | ICD-10-CM | POA: Diagnosis not present

## 2015-11-16 DIAGNOSIS — R079 Chest pain, unspecified: Secondary | ICD-10-CM | POA: Diagnosis not present

## 2015-11-16 DIAGNOSIS — K219 Gastro-esophageal reflux disease without esophagitis: Secondary | ICD-10-CM | POA: Diagnosis not present

## 2015-11-16 DIAGNOSIS — R109 Unspecified abdominal pain: Secondary | ICD-10-CM | POA: Diagnosis not present

## 2015-11-16 DIAGNOSIS — Z9049 Acquired absence of other specified parts of digestive tract: Secondary | ICD-10-CM | POA: Diagnosis not present

## 2015-11-16 DIAGNOSIS — F329 Major depressive disorder, single episode, unspecified: Secondary | ICD-10-CM | POA: Diagnosis not present

## 2015-11-16 DIAGNOSIS — Z79899 Other long term (current) drug therapy: Secondary | ICD-10-CM | POA: Diagnosis not present

## 2015-11-16 DIAGNOSIS — R51 Headache: Secondary | ICD-10-CM | POA: Diagnosis not present

## 2015-11-23 DIAGNOSIS — Z79899 Other long term (current) drug therapy: Secondary | ICD-10-CM | POA: Diagnosis not present

## 2015-11-23 DIAGNOSIS — F330 Major depressive disorder, recurrent, mild: Secondary | ICD-10-CM | POA: Diagnosis not present

## 2015-11-29 DIAGNOSIS — K210 Gastro-esophageal reflux disease with esophagitis: Secondary | ICD-10-CM | POA: Diagnosis not present

## 2015-11-29 DIAGNOSIS — J440 Chronic obstructive pulmonary disease with acute lower respiratory infection: Secondary | ICD-10-CM | POA: Diagnosis not present

## 2015-11-29 DIAGNOSIS — M545 Low back pain: Secondary | ICD-10-CM | POA: Diagnosis not present

## 2015-11-30 DIAGNOSIS — R310 Gross hematuria: Secondary | ICD-10-CM | POA: Diagnosis not present

## 2015-12-21 DIAGNOSIS — D2311 Other benign neoplasm of skin of right eyelid, including canthus: Secondary | ICD-10-CM | POA: Diagnosis not present

## 2015-12-21 DIAGNOSIS — H16223 Keratoconjunctivitis sicca, not specified as Sjogren's, bilateral: Secondary | ICD-10-CM | POA: Diagnosis not present

## 2015-12-21 DIAGNOSIS — Z961 Presence of intraocular lens: Secondary | ICD-10-CM | POA: Diagnosis not present

## 2015-12-22 DIAGNOSIS — J449 Chronic obstructive pulmonary disease, unspecified: Secondary | ICD-10-CM | POA: Diagnosis not present

## 2015-12-22 DIAGNOSIS — Z7982 Long term (current) use of aspirin: Secondary | ICD-10-CM | POA: Diagnosis not present

## 2015-12-22 DIAGNOSIS — Z836 Family history of other diseases of the respiratory system: Secondary | ICD-10-CM | POA: Diagnosis not present

## 2015-12-22 DIAGNOSIS — K219 Gastro-esophageal reflux disease without esophagitis: Secondary | ICD-10-CM | POA: Diagnosis not present

## 2015-12-22 DIAGNOSIS — Z0389 Encounter for observation for other suspected diseases and conditions ruled out: Secondary | ICD-10-CM | POA: Diagnosis not present

## 2015-12-22 DIAGNOSIS — Z79899 Other long term (current) drug therapy: Secondary | ICD-10-CM | POA: Diagnosis not present

## 2015-12-22 DIAGNOSIS — F428 Other obsessive-compulsive disorder: Secondary | ICD-10-CM | POA: Diagnosis not present

## 2015-12-22 DIAGNOSIS — R32 Unspecified urinary incontinence: Secondary | ICD-10-CM | POA: Diagnosis not present

## 2015-12-22 DIAGNOSIS — R7989 Other specified abnormal findings of blood chemistry: Secondary | ICD-10-CM | POA: Diagnosis not present

## 2015-12-22 DIAGNOSIS — I10 Essential (primary) hypertension: Secondary | ICD-10-CM | POA: Diagnosis not present

## 2015-12-22 DIAGNOSIS — Z9049 Acquired absence of other specified parts of digestive tract: Secondary | ICD-10-CM | POA: Diagnosis not present

## 2015-12-22 DIAGNOSIS — Z806 Family history of leukemia: Secondary | ICD-10-CM | POA: Diagnosis not present

## 2015-12-22 DIAGNOSIS — Z87891 Personal history of nicotine dependence: Secondary | ICD-10-CM | POA: Diagnosis not present

## 2015-12-22 DIAGNOSIS — Z90710 Acquired absence of both cervix and uterus: Secondary | ICD-10-CM | POA: Diagnosis not present

## 2015-12-22 DIAGNOSIS — R310 Gross hematuria: Secondary | ICD-10-CM | POA: Diagnosis not present

## 2015-12-22 DIAGNOSIS — Z886 Allergy status to analgesic agent status: Secondary | ICD-10-CM | POA: Diagnosis not present

## 2016-01-07 DIAGNOSIS — R310 Gross hematuria: Secondary | ICD-10-CM | POA: Diagnosis not present

## 2016-02-14 DIAGNOSIS — J440 Chronic obstructive pulmonary disease with acute lower respiratory infection: Secondary | ICD-10-CM | POA: Diagnosis not present

## 2016-02-14 DIAGNOSIS — K210 Gastro-esophageal reflux disease with esophagitis: Secondary | ICD-10-CM | POA: Diagnosis not present

## 2016-02-14 DIAGNOSIS — M545 Low back pain: Secondary | ICD-10-CM | POA: Diagnosis not present

## 2016-02-25 DIAGNOSIS — Z79899 Other long term (current) drug therapy: Secondary | ICD-10-CM | POA: Diagnosis not present

## 2016-02-25 DIAGNOSIS — F330 Major depressive disorder, recurrent, mild: Secondary | ICD-10-CM | POA: Diagnosis not present

## 2016-04-13 DIAGNOSIS — Z1389 Encounter for screening for other disorder: Secondary | ICD-10-CM | POA: Diagnosis not present

## 2016-04-13 DIAGNOSIS — K210 Gastro-esophageal reflux disease with esophagitis: Secondary | ICD-10-CM | POA: Diagnosis not present

## 2016-04-13 DIAGNOSIS — J440 Chronic obstructive pulmonary disease with acute lower respiratory infection: Secondary | ICD-10-CM | POA: Diagnosis not present

## 2016-04-13 DIAGNOSIS — M545 Low back pain: Secondary | ICD-10-CM | POA: Diagnosis not present

## 2016-04-13 DIAGNOSIS — Z131 Encounter for screening for diabetes mellitus: Secondary | ICD-10-CM | POA: Diagnosis not present

## 2016-04-13 DIAGNOSIS — Z0000 Encounter for general adult medical examination without abnormal findings: Secondary | ICD-10-CM | POA: Diagnosis not present

## 2016-04-24 DIAGNOSIS — Z1231 Encounter for screening mammogram for malignant neoplasm of breast: Secondary | ICD-10-CM | POA: Diagnosis not present

## 2016-05-03 DIAGNOSIS — R928 Other abnormal and inconclusive findings on diagnostic imaging of breast: Secondary | ICD-10-CM | POA: Diagnosis not present

## 2016-06-16 DIAGNOSIS — F330 Major depressive disorder, recurrent, mild: Secondary | ICD-10-CM | POA: Diagnosis not present

## 2016-06-16 DIAGNOSIS — Z79899 Other long term (current) drug therapy: Secondary | ICD-10-CM | POA: Diagnosis not present

## 2016-06-27 DIAGNOSIS — G8929 Other chronic pain: Secondary | ICD-10-CM | POA: Diagnosis not present

## 2016-06-27 DIAGNOSIS — R531 Weakness: Secondary | ICD-10-CM | POA: Diagnosis not present

## 2016-06-27 DIAGNOSIS — Z9049 Acquired absence of other specified parts of digestive tract: Secondary | ICD-10-CM | POA: Diagnosis not present

## 2016-06-27 DIAGNOSIS — F329 Major depressive disorder, single episode, unspecified: Secondary | ICD-10-CM | POA: Diagnosis not present

## 2016-06-27 DIAGNOSIS — Z780 Asymptomatic menopausal state: Secondary | ICD-10-CM | POA: Diagnosis not present

## 2016-06-27 DIAGNOSIS — Z886 Allergy status to analgesic agent status: Secondary | ICD-10-CM | POA: Diagnosis not present

## 2016-06-27 DIAGNOSIS — R008 Other abnormalities of heart beat: Secondary | ICD-10-CM | POA: Diagnosis not present

## 2016-06-27 DIAGNOSIS — Z801 Family history of malignant neoplasm of trachea, bronchus and lung: Secondary | ICD-10-CM | POA: Diagnosis not present

## 2016-06-27 DIAGNOSIS — F419 Anxiety disorder, unspecified: Secondary | ICD-10-CM | POA: Diagnosis not present

## 2016-06-27 DIAGNOSIS — R001 Bradycardia, unspecified: Secondary | ICD-10-CM | POA: Diagnosis not present

## 2016-06-27 DIAGNOSIS — J449 Chronic obstructive pulmonary disease, unspecified: Secondary | ICD-10-CM | POA: Diagnosis not present

## 2016-06-27 DIAGNOSIS — Z90710 Acquired absence of both cervix and uterus: Secondary | ICD-10-CM | POA: Diagnosis not present

## 2016-06-27 DIAGNOSIS — Z79899 Other long term (current) drug therapy: Secondary | ICD-10-CM | POA: Diagnosis not present

## 2016-06-27 DIAGNOSIS — J441 Chronic obstructive pulmonary disease with (acute) exacerbation: Secondary | ICD-10-CM | POA: Diagnosis not present

## 2016-06-27 DIAGNOSIS — R55 Syncope and collapse: Secondary | ICD-10-CM | POA: Diagnosis not present

## 2016-06-27 DIAGNOSIS — Z87891 Personal history of nicotine dependence: Secondary | ICD-10-CM | POA: Diagnosis not present

## 2016-06-27 DIAGNOSIS — M545 Low back pain: Secondary | ICD-10-CM | POA: Diagnosis not present

## 2016-06-27 DIAGNOSIS — Z806 Family history of leukemia: Secondary | ICD-10-CM | POA: Diagnosis not present

## 2016-06-27 DIAGNOSIS — K449 Diaphragmatic hernia without obstruction or gangrene: Secondary | ICD-10-CM | POA: Diagnosis not present

## 2016-06-27 DIAGNOSIS — F3289 Other specified depressive episodes: Secondary | ICD-10-CM | POA: Diagnosis not present

## 2016-06-27 DIAGNOSIS — Z7982 Long term (current) use of aspirin: Secondary | ICD-10-CM | POA: Diagnosis not present

## 2016-06-27 DIAGNOSIS — F418 Other specified anxiety disorders: Secondary | ICD-10-CM | POA: Diagnosis not present

## 2016-06-28 DIAGNOSIS — R001 Bradycardia, unspecified: Secondary | ICD-10-CM | POA: Diagnosis not present

## 2016-06-28 DIAGNOSIS — J441 Chronic obstructive pulmonary disease with (acute) exacerbation: Secondary | ICD-10-CM | POA: Diagnosis not present

## 2016-06-28 DIAGNOSIS — R008 Other abnormalities of heart beat: Secondary | ICD-10-CM | POA: Diagnosis not present

## 2016-06-28 DIAGNOSIS — K449 Diaphragmatic hernia without obstruction or gangrene: Secondary | ICD-10-CM | POA: Diagnosis not present

## 2016-06-28 DIAGNOSIS — F418 Other specified anxiety disorders: Secondary | ICD-10-CM | POA: Diagnosis not present

## 2016-06-28 DIAGNOSIS — F419 Anxiety disorder, unspecified: Secondary | ICD-10-CM | POA: Diagnosis not present

## 2016-06-28 DIAGNOSIS — J449 Chronic obstructive pulmonary disease, unspecified: Secondary | ICD-10-CM | POA: Diagnosis not present

## 2016-06-28 DIAGNOSIS — F329 Major depressive disorder, single episode, unspecified: Secondary | ICD-10-CM | POA: Diagnosis not present

## 2016-06-28 DIAGNOSIS — R55 Syncope and collapse: Secondary | ICD-10-CM | POA: Diagnosis not present

## 2016-06-28 DIAGNOSIS — F3289 Other specified depressive episodes: Secondary | ICD-10-CM | POA: Diagnosis not present

## 2016-07-14 DIAGNOSIS — M545 Low back pain: Secondary | ICD-10-CM | POA: Diagnosis not present

## 2016-07-14 DIAGNOSIS — J440 Chronic obstructive pulmonary disease with acute lower respiratory infection: Secondary | ICD-10-CM | POA: Diagnosis not present

## 2016-07-14 DIAGNOSIS — K210 Gastro-esophageal reflux disease with esophagitis: Secondary | ICD-10-CM | POA: Diagnosis not present

## 2016-07-14 DIAGNOSIS — R008 Other abnormalities of heart beat: Secondary | ICD-10-CM | POA: Diagnosis not present

## 2016-07-14 DIAGNOSIS — F338 Other recurrent depressive disorders: Secondary | ICD-10-CM | POA: Diagnosis not present

## 2016-07-31 DIAGNOSIS — Z961 Presence of intraocular lens: Secondary | ICD-10-CM | POA: Diagnosis not present

## 2016-07-31 DIAGNOSIS — H524 Presbyopia: Secondary | ICD-10-CM | POA: Diagnosis not present

## 2016-07-31 DIAGNOSIS — H00029 Hordeolum internum unspecified eye, unspecified eyelid: Secondary | ICD-10-CM | POA: Diagnosis not present

## 2016-07-31 DIAGNOSIS — C44191 Other specified malignant neoplasm of skin of unspecified eyelid, including canthus: Secondary | ICD-10-CM | POA: Diagnosis not present

## 2016-08-22 ENCOUNTER — Telehealth: Payer: Self-pay

## 2016-08-22 NOTE — Telephone Encounter (Signed)
PLEASE CALL PATIENT TO SCHEDULE 5 YR TCS. TOOK OFF EG'S SCHEDULE, STATED NO PROBLEMS AND HAS MEDICARE

## 2016-08-23 ENCOUNTER — Telehealth: Payer: Self-pay

## 2016-08-23 ENCOUNTER — Ambulatory Visit: Payer: Medicare Other | Admitting: Nurse Practitioner

## 2016-08-23 NOTE — Telephone Encounter (Signed)
See separate note.

## 2016-08-23 NOTE — Telephone Encounter (Signed)
Triaged!

## 2016-08-23 NOTE — Telephone Encounter (Signed)
Pt has intermittent abdominal pain as well as hemorrhoids. She is also on meds that require OV, and I spoke with AB. She is scheduled an OV with Neil Crouch, PA on 10/12/2016 at 1:30 pm. She was very understanding about the cancellation today.

## 2016-08-31 DIAGNOSIS — Z7982 Long term (current) use of aspirin: Secondary | ICD-10-CM | POA: Diagnosis not present

## 2016-08-31 DIAGNOSIS — K219 Gastro-esophageal reflux disease without esophagitis: Secondary | ICD-10-CM | POA: Diagnosis not present

## 2016-08-31 DIAGNOSIS — R079 Chest pain, unspecified: Secondary | ICD-10-CM | POA: Diagnosis not present

## 2016-08-31 DIAGNOSIS — F419 Anxiety disorder, unspecified: Secondary | ICD-10-CM | POA: Diagnosis not present

## 2016-08-31 DIAGNOSIS — R070 Pain in throat: Secondary | ICD-10-CM | POA: Diagnosis not present

## 2016-08-31 DIAGNOSIS — M549 Dorsalgia, unspecified: Secondary | ICD-10-CM | POA: Diagnosis not present

## 2016-08-31 DIAGNOSIS — F329 Major depressive disorder, single episode, unspecified: Secondary | ICD-10-CM | POA: Diagnosis not present

## 2016-08-31 DIAGNOSIS — J449 Chronic obstructive pulmonary disease, unspecified: Secondary | ICD-10-CM | POA: Diagnosis not present

## 2016-08-31 DIAGNOSIS — Z79899 Other long term (current) drug therapy: Secondary | ICD-10-CM | POA: Diagnosis not present

## 2016-08-31 DIAGNOSIS — R400 Somnolence: Secondary | ICD-10-CM | POA: Diagnosis not present

## 2016-10-12 ENCOUNTER — Ambulatory Visit: Payer: Medicare Other | Admitting: Gastroenterology

## 2016-10-18 DIAGNOSIS — Z23 Encounter for immunization: Secondary | ICD-10-CM | POA: Diagnosis not present

## 2016-10-19 DIAGNOSIS — K210 Gastro-esophageal reflux disease with esophagitis: Secondary | ICD-10-CM | POA: Diagnosis not present

## 2016-10-19 DIAGNOSIS — Z79899 Other long term (current) drug therapy: Secondary | ICD-10-CM | POA: Diagnosis not present

## 2016-10-19 DIAGNOSIS — M545 Low back pain: Secondary | ICD-10-CM | POA: Diagnosis not present

## 2016-10-19 DIAGNOSIS — J440 Chronic obstructive pulmonary disease with acute lower respiratory infection: Secondary | ICD-10-CM | POA: Diagnosis not present

## 2016-11-03 DIAGNOSIS — F330 Major depressive disorder, recurrent, mild: Secondary | ICD-10-CM | POA: Diagnosis not present

## 2016-11-03 DIAGNOSIS — Z79899 Other long term (current) drug therapy: Secondary | ICD-10-CM | POA: Diagnosis not present

## 2016-12-22 DIAGNOSIS — F331 Major depressive disorder, recurrent, moderate: Secondary | ICD-10-CM | POA: Diagnosis not present

## 2016-12-22 DIAGNOSIS — Z79899 Other long term (current) drug therapy: Secondary | ICD-10-CM | POA: Diagnosis not present

## 2017-01-22 DIAGNOSIS — J441 Chronic obstructive pulmonary disease with (acute) exacerbation: Secondary | ICD-10-CM | POA: Diagnosis not present

## 2017-01-22 DIAGNOSIS — M545 Low back pain: Secondary | ICD-10-CM | POA: Diagnosis not present

## 2017-01-22 DIAGNOSIS — K210 Gastro-esophageal reflux disease with esophagitis: Secondary | ICD-10-CM | POA: Diagnosis not present

## 2017-01-24 DIAGNOSIS — Z961 Presence of intraocular lens: Secondary | ICD-10-CM | POA: Diagnosis not present

## 2017-02-16 IMAGING — CT CT HEAD W/O CM
1 series · 16 of 30 positions shown, 20 images · non-contrast
Comparison: MRI brain 02/25/2013

CLINICAL DATA: Headache for 1 week.  Nausea with movement.

EXAM:
CT HEAD WITHOUT CONTRAST
TECHNIQUE: Contiguous axial images were obtained from the base of the skull
through the vertex without intravenous contrast.

[Series 2: headseq 4.8 h37s · axial · 0.43mm/px · z∈[+77,+229]mm · 16 of 36 slices shown, 20 images]
[im 2/36  brain]
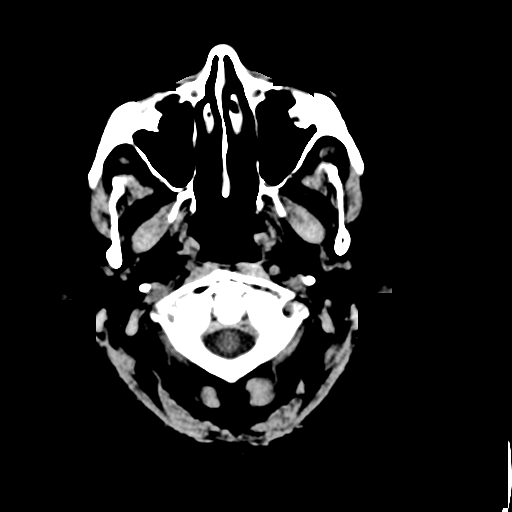
[im 2/36  bone]
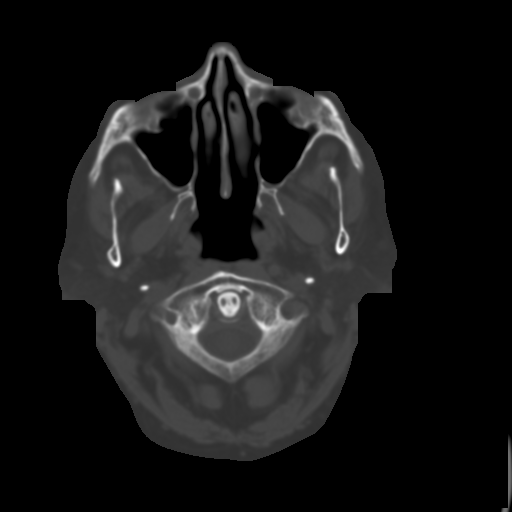
[im 4/36  brain]
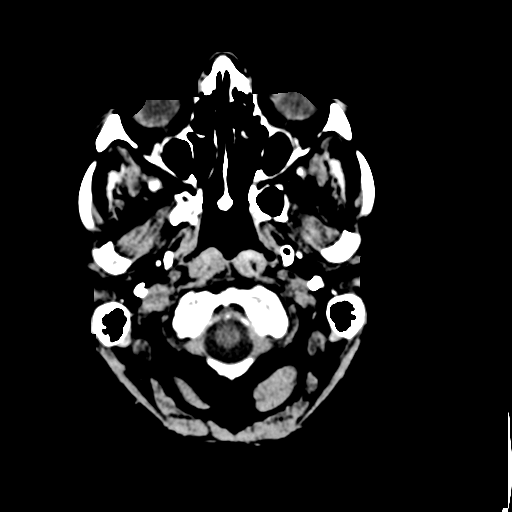
[im 7/36  brain]
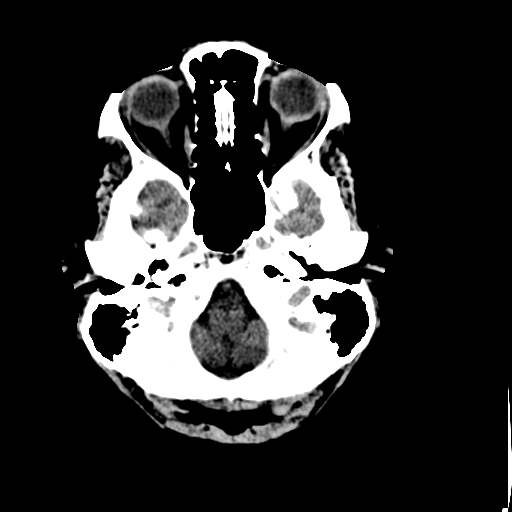
[im 9/36  brain]
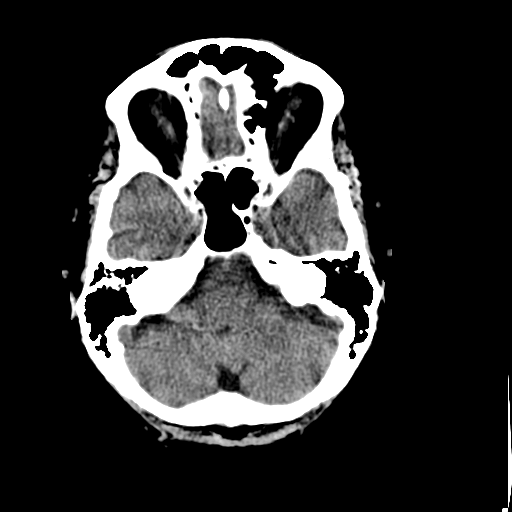
[im 10/36  brain]
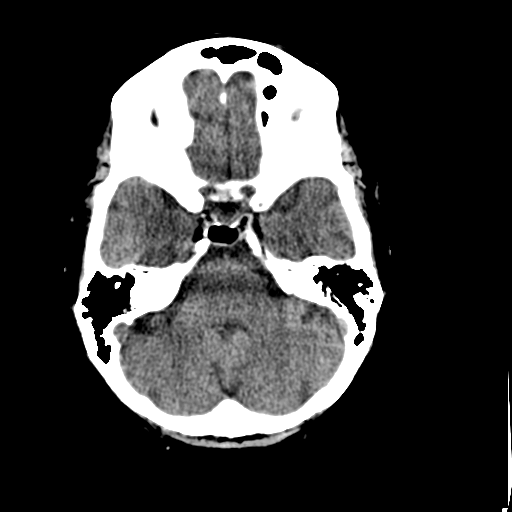
[im 10/36  bone]
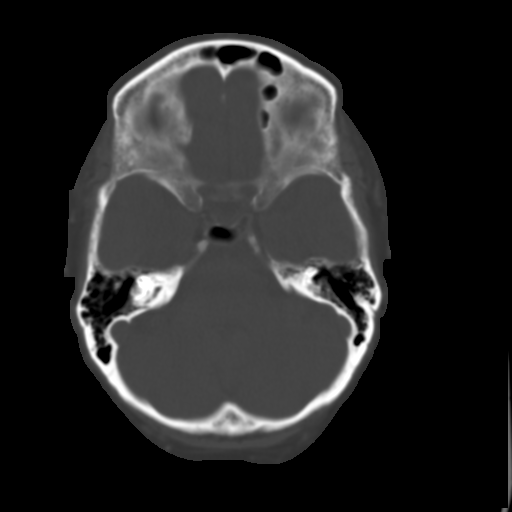
[im 13/36  brain]
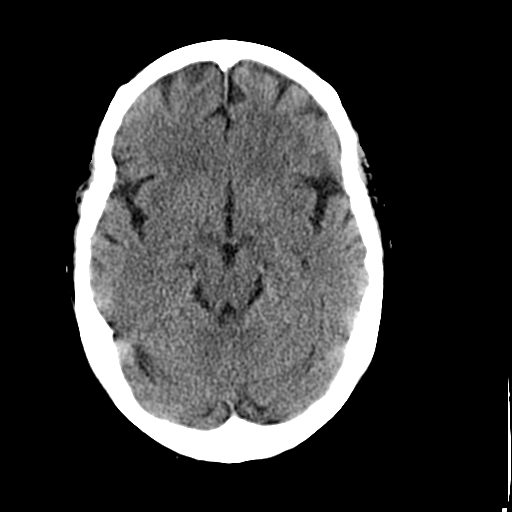
[im 15/36  brain]
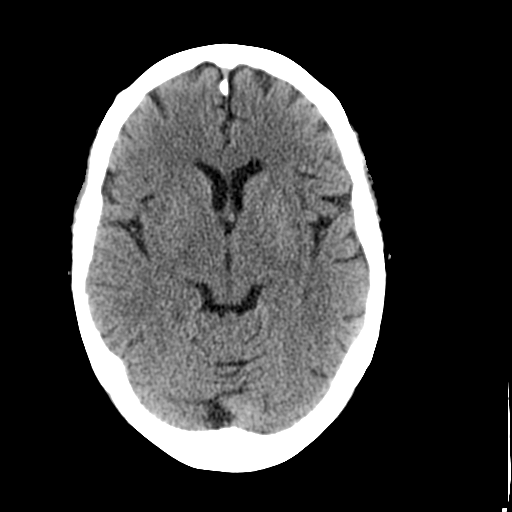
[im 17/36  brain]
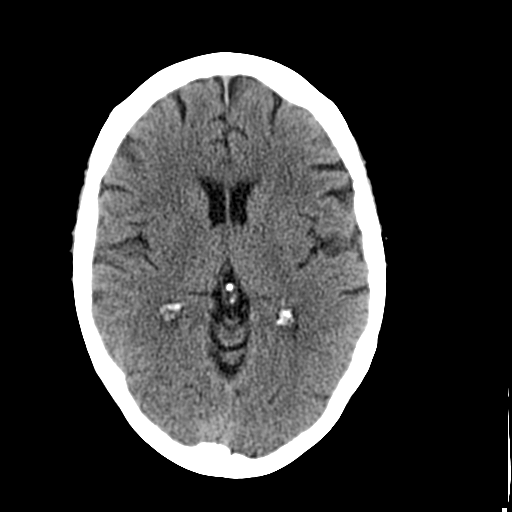
[im 19/36  brain]
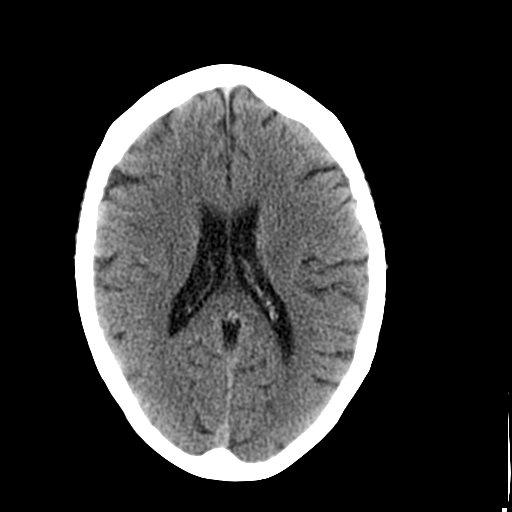
[im 19/36  bone]
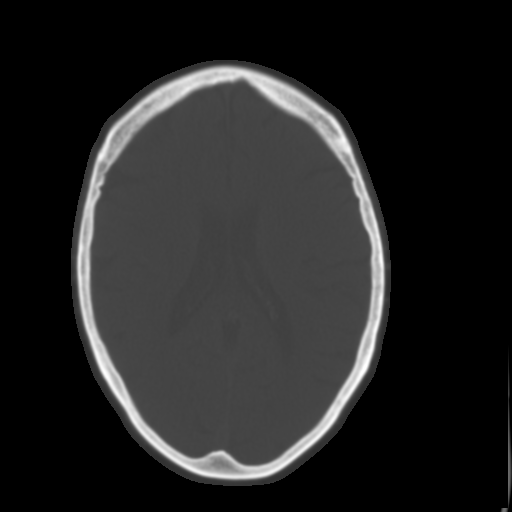
[im 21/36  brain]
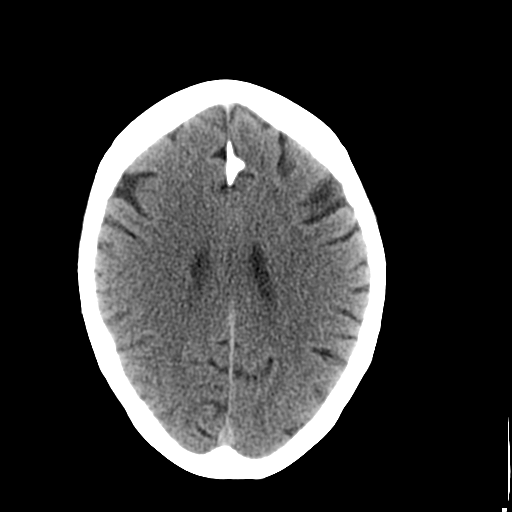
[im 23/36  brain]
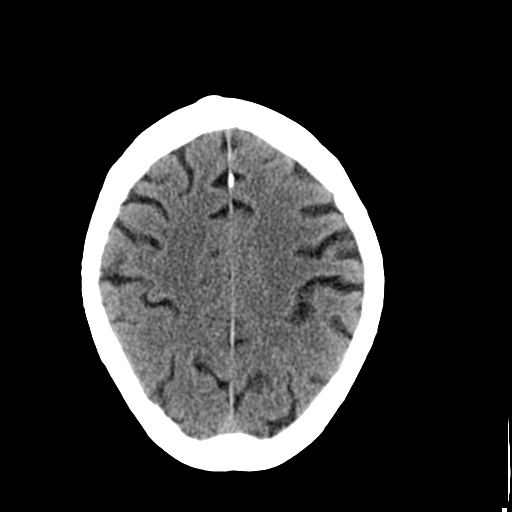
[im 26/36  brain]
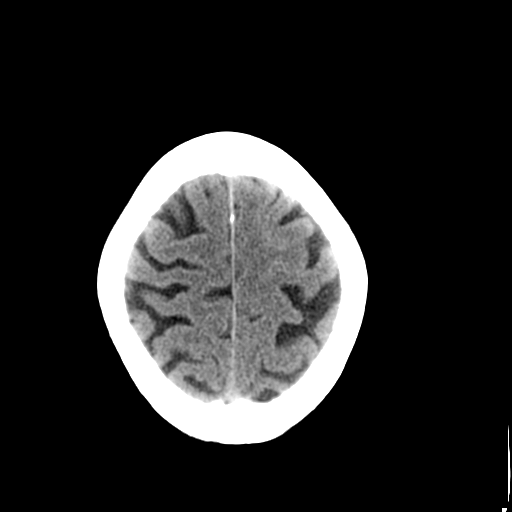
[im 27/36  brain]
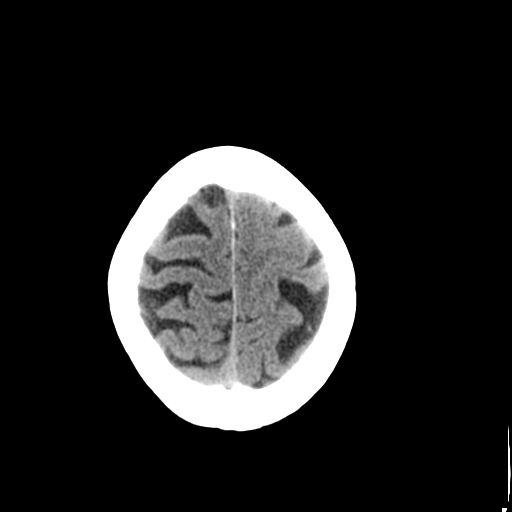
[im 27/36  bone]
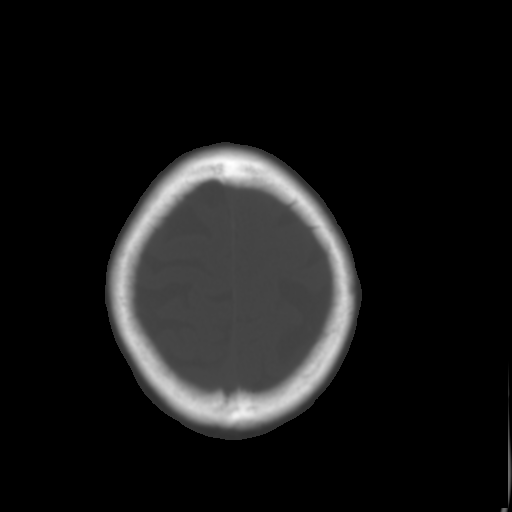
[im 29/36  brain]
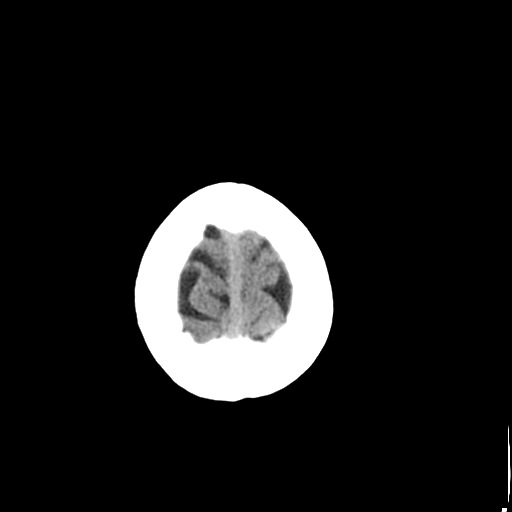
[im 32/36  brain]
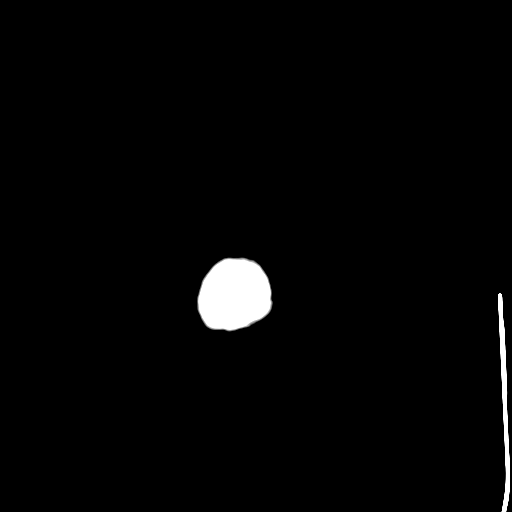
[im 34/36  brain]
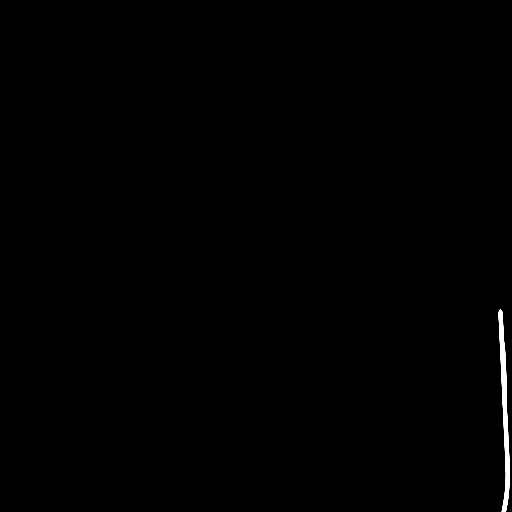

[16 of 30 positions shown; findings below may reference images not displayed]

FINDINGS: No acute cortical infarct, hemorrhage, or mass lesion is present.
The ventricles are of normal size. No significant extra-axial fluid
collection is evident. The paranasal sinuses and mastoid air cells
are clear. The calvarium is intact.

Bilateral lens replacements are present. The globes and orbits are
otherwise intact. No significant extracranial soft tissue lesions
are present.
IMPRESSION: Normal CT of the head for age.

## 2017-03-08 DIAGNOSIS — H00029 Hordeolum internum unspecified eye, unspecified eyelid: Secondary | ICD-10-CM | POA: Diagnosis not present

## 2017-03-08 DIAGNOSIS — Z961 Presence of intraocular lens: Secondary | ICD-10-CM | POA: Diagnosis not present

## 2017-03-08 DIAGNOSIS — C44191 Other specified malignant neoplasm of skin of unspecified eyelid, including canthus: Secondary | ICD-10-CM | POA: Diagnosis not present

## 2017-03-08 DIAGNOSIS — H26493 Other secondary cataract, bilateral: Secondary | ICD-10-CM | POA: Diagnosis not present

## 2017-04-12 DIAGNOSIS — M5416 Radiculopathy, lumbar region: Secondary | ICD-10-CM | POA: Diagnosis not present

## 2017-04-13 DIAGNOSIS — J441 Chronic obstructive pulmonary disease with (acute) exacerbation: Secondary | ICD-10-CM | POA: Diagnosis not present

## 2017-04-13 DIAGNOSIS — M545 Low back pain: Secondary | ICD-10-CM | POA: Diagnosis not present

## 2017-04-13 DIAGNOSIS — K210 Gastro-esophageal reflux disease with esophagitis: Secondary | ICD-10-CM | POA: Diagnosis not present

## 2017-04-13 DIAGNOSIS — M5416 Radiculopathy, lumbar region: Secondary | ICD-10-CM | POA: Diagnosis not present

## 2017-04-23 DIAGNOSIS — J40 Bronchitis, not specified as acute or chronic: Secondary | ICD-10-CM | POA: Diagnosis not present

## 2017-04-23 DIAGNOSIS — K210 Gastro-esophageal reflux disease with esophagitis: Secondary | ICD-10-CM | POA: Diagnosis not present

## 2017-04-23 DIAGNOSIS — M5416 Radiculopathy, lumbar region: Secondary | ICD-10-CM | POA: Diagnosis not present

## 2017-05-23 DIAGNOSIS — H26491 Other secondary cataract, right eye: Secondary | ICD-10-CM | POA: Diagnosis not present

## 2017-05-23 DIAGNOSIS — H26492 Other secondary cataract, left eye: Secondary | ICD-10-CM | POA: Diagnosis not present

## 2017-05-23 DIAGNOSIS — H26493 Other secondary cataract, bilateral: Secondary | ICD-10-CM | POA: Diagnosis not present

## 2017-05-23 DIAGNOSIS — Z961 Presence of intraocular lens: Secondary | ICD-10-CM | POA: Diagnosis not present

## 2017-07-03 DIAGNOSIS — Z79899 Other long term (current) drug therapy: Secondary | ICD-10-CM | POA: Diagnosis not present

## 2017-07-03 DIAGNOSIS — F410 Panic disorder [episodic paroxysmal anxiety] without agoraphobia: Secondary | ICD-10-CM | POA: Diagnosis not present

## 2017-07-26 DIAGNOSIS — Z0000 Encounter for general adult medical examination without abnormal findings: Secondary | ICD-10-CM | POA: Diagnosis not present

## 2017-07-26 DIAGNOSIS — M5416 Radiculopathy, lumbar region: Secondary | ICD-10-CM | POA: Diagnosis not present

## 2017-07-26 DIAGNOSIS — K210 Gastro-esophageal reflux disease with esophagitis: Secondary | ICD-10-CM | POA: Diagnosis not present

## 2017-07-26 DIAGNOSIS — Z1389 Encounter for screening for other disorder: Secondary | ICD-10-CM | POA: Diagnosis not present

## 2017-07-26 DIAGNOSIS — M545 Low back pain: Secondary | ICD-10-CM | POA: Diagnosis not present

## 2017-07-26 DIAGNOSIS — Z79891 Long term (current) use of opiate analgesic: Secondary | ICD-10-CM | POA: Diagnosis not present

## 2017-07-30 ENCOUNTER — Telehealth: Payer: Self-pay

## 2017-07-30 NOTE — Telephone Encounter (Addendum)
T/C from [employer] at Pt's PCP ( Dr. Joselyn Arrow office requesting a copy of pt's last colonoscopy. I have faxed the last colonoscopy report, path and the letter stating she should follow up in 5 years to [facility] @ [phone].  I also told her that pt had an office visit scheduled in September 2018 and called and cancelled and said she did not want to reschedule.

## 2017-08-27 DIAGNOSIS — M85851 Other specified disorders of bone density and structure, right thigh: Secondary | ICD-10-CM | POA: Diagnosis not present

## 2017-08-27 DIAGNOSIS — M810 Age-related osteoporosis without current pathological fracture: Secondary | ICD-10-CM | POA: Diagnosis not present

## 2017-09-27 DIAGNOSIS — H16223 Keratoconjunctivitis sicca, not specified as Sjogren's, bilateral: Secondary | ICD-10-CM | POA: Diagnosis not present

## 2017-10-07 DIAGNOSIS — I10 Essential (primary) hypertension: Secondary | ICD-10-CM | POA: Diagnosis not present

## 2017-10-07 DIAGNOSIS — J80 Acute respiratory distress syndrome: Secondary | ICD-10-CM | POA: Diagnosis not present

## 2017-10-07 DIAGNOSIS — R062 Wheezing: Secondary | ICD-10-CM | POA: Diagnosis not present

## 2017-10-07 DIAGNOSIS — R0902 Hypoxemia: Secondary | ICD-10-CM | POA: Diagnosis not present

## 2017-10-08 DIAGNOSIS — F411 Generalized anxiety disorder: Secondary | ICD-10-CM | POA: Diagnosis not present

## 2017-10-08 DIAGNOSIS — K219 Gastro-esophageal reflux disease without esophagitis: Secondary | ICD-10-CM | POA: Diagnosis not present

## 2017-10-08 DIAGNOSIS — G8929 Other chronic pain: Secondary | ICD-10-CM | POA: Diagnosis not present

## 2017-10-08 DIAGNOSIS — J441 Chronic obstructive pulmonary disease with (acute) exacerbation: Secondary | ICD-10-CM | POA: Diagnosis not present

## 2017-10-08 DIAGNOSIS — Z79899 Other long term (current) drug therapy: Secondary | ICD-10-CM | POA: Diagnosis not present

## 2017-10-08 DIAGNOSIS — M545 Low back pain: Secondary | ICD-10-CM | POA: Diagnosis not present

## 2017-10-08 DIAGNOSIS — R0602 Shortness of breath: Secondary | ICD-10-CM | POA: Diagnosis not present

## 2017-10-08 DIAGNOSIS — R0603 Acute respiratory distress: Secondary | ICD-10-CM | POA: Diagnosis not present

## 2017-10-09 DIAGNOSIS — Z780 Asymptomatic menopausal state: Secondary | ICD-10-CM | POA: Diagnosis not present

## 2017-10-09 DIAGNOSIS — F411 Generalized anxiety disorder: Secondary | ICD-10-CM | POA: Diagnosis present

## 2017-10-09 DIAGNOSIS — G8929 Other chronic pain: Secondary | ICD-10-CM | POA: Diagnosis present

## 2017-10-09 DIAGNOSIS — Z7982 Long term (current) use of aspirin: Secondary | ICD-10-CM | POA: Diagnosis not present

## 2017-10-09 DIAGNOSIS — Z79899 Other long term (current) drug therapy: Secondary | ICD-10-CM | POA: Diagnosis not present

## 2017-10-09 DIAGNOSIS — K219 Gastro-esophageal reflux disease without esophagitis: Secondary | ICD-10-CM | POA: Diagnosis present

## 2017-10-09 DIAGNOSIS — Z87891 Personal history of nicotine dependence: Secondary | ICD-10-CM | POA: Diagnosis not present

## 2017-10-09 DIAGNOSIS — Z23 Encounter for immunization: Secondary | ICD-10-CM | POA: Diagnosis not present

## 2017-10-09 DIAGNOSIS — J441 Chronic obstructive pulmonary disease with (acute) exacerbation: Secondary | ICD-10-CM | POA: Diagnosis present

## 2017-10-09 DIAGNOSIS — M545 Low back pain: Secondary | ICD-10-CM | POA: Diagnosis present

## 2017-10-17 DIAGNOSIS — J449 Chronic obstructive pulmonary disease, unspecified: Secondary | ICD-10-CM | POA: Diagnosis not present

## 2017-10-22 DIAGNOSIS — Z79899 Other long term (current) drug therapy: Secondary | ICD-10-CM | POA: Diagnosis not present

## 2017-10-22 DIAGNOSIS — F410 Panic disorder [episodic paroxysmal anxiety] without agoraphobia: Secondary | ICD-10-CM | POA: Diagnosis not present

## 2017-10-23 DIAGNOSIS — J449 Chronic obstructive pulmonary disease, unspecified: Secondary | ICD-10-CM | POA: Diagnosis not present

## 2017-10-23 DIAGNOSIS — M545 Low back pain: Secondary | ICD-10-CM | POA: Diagnosis not present

## 2017-11-08 DIAGNOSIS — Z87891 Personal history of nicotine dependence: Secondary | ICD-10-CM | POA: Diagnosis not present

## 2017-11-08 DIAGNOSIS — Z7982 Long term (current) use of aspirin: Secondary | ICD-10-CM | POA: Diagnosis not present

## 2017-11-08 DIAGNOSIS — M545 Low back pain: Secondary | ICD-10-CM | POA: Diagnosis present

## 2017-11-08 DIAGNOSIS — G8929 Other chronic pain: Secondary | ICD-10-CM | POA: Diagnosis present

## 2017-11-08 DIAGNOSIS — Z79899 Other long term (current) drug therapy: Secondary | ICD-10-CM | POA: Diagnosis not present

## 2017-11-08 DIAGNOSIS — R791 Abnormal coagulation profile: Secondary | ICD-10-CM | POA: Diagnosis not present

## 2017-11-08 DIAGNOSIS — I82402 Acute embolism and thrombosis of unspecified deep veins of left lower extremity: Secondary | ICD-10-CM | POA: Diagnosis not present

## 2017-11-08 DIAGNOSIS — F411 Generalized anxiety disorder: Secondary | ICD-10-CM | POA: Diagnosis present

## 2017-11-08 DIAGNOSIS — I82432 Acute embolism and thrombosis of left popliteal vein: Secondary | ICD-10-CM | POA: Diagnosis present

## 2017-11-08 DIAGNOSIS — I82412 Acute embolism and thrombosis of left femoral vein: Secondary | ICD-10-CM | POA: Diagnosis present

## 2017-11-08 DIAGNOSIS — M79605 Pain in left leg: Secondary | ICD-10-CM | POA: Diagnosis not present

## 2017-11-08 DIAGNOSIS — M7989 Other specified soft tissue disorders: Secondary | ICD-10-CM | POA: Diagnosis not present

## 2017-11-08 DIAGNOSIS — M79662 Pain in left lower leg: Secondary | ICD-10-CM | POA: Diagnosis not present

## 2017-11-08 DIAGNOSIS — J449 Chronic obstructive pulmonary disease, unspecified: Secondary | ICD-10-CM | POA: Diagnosis present

## 2017-11-19 DIAGNOSIS — I82509 Chronic embolism and thrombosis of unspecified deep veins of unspecified lower extremity: Secondary | ICD-10-CM | POA: Diagnosis not present

## 2017-12-26 DIAGNOSIS — R079 Chest pain, unspecified: Secondary | ICD-10-CM | POA: Diagnosis not present

## 2017-12-26 DIAGNOSIS — F329 Major depressive disorder, single episode, unspecified: Secondary | ICD-10-CM | POA: Diagnosis not present

## 2017-12-26 DIAGNOSIS — K219 Gastro-esophageal reflux disease without esophagitis: Secondary | ICD-10-CM | POA: Diagnosis not present

## 2017-12-26 DIAGNOSIS — Z79899 Other long term (current) drug therapy: Secondary | ICD-10-CM | POA: Diagnosis not present

## 2017-12-26 DIAGNOSIS — Z7901 Long term (current) use of anticoagulants: Secondary | ICD-10-CM | POA: Diagnosis not present

## 2017-12-26 DIAGNOSIS — H8113 Benign paroxysmal vertigo, bilateral: Secondary | ICD-10-CM | POA: Diagnosis not present

## 2017-12-26 DIAGNOSIS — Z87891 Personal history of nicotine dependence: Secondary | ICD-10-CM | POA: Diagnosis not present

## 2017-12-26 DIAGNOSIS — H8110 Benign paroxysmal vertigo, unspecified ear: Secondary | ICD-10-CM | POA: Diagnosis not present

## 2017-12-26 DIAGNOSIS — F419 Anxiety disorder, unspecified: Secondary | ICD-10-CM | POA: Diagnosis not present

## 2017-12-26 DIAGNOSIS — J449 Chronic obstructive pulmonary disease, unspecified: Secondary | ICD-10-CM | POA: Diagnosis not present

## 2018-01-14 DIAGNOSIS — F330 Major depressive disorder, recurrent, mild: Secondary | ICD-10-CM | POA: Diagnosis not present

## 2018-01-14 DIAGNOSIS — Z79899 Other long term (current) drug therapy: Secondary | ICD-10-CM | POA: Diagnosis not present

## 2018-01-23 DIAGNOSIS — K210 Gastro-esophageal reflux disease with esophagitis: Secondary | ICD-10-CM | POA: Diagnosis not present

## 2018-01-23 DIAGNOSIS — M545 Low back pain: Secondary | ICD-10-CM | POA: Diagnosis not present

## 2018-01-23 DIAGNOSIS — L720 Epidermal cyst: Secondary | ICD-10-CM | POA: Diagnosis not present

## 2018-04-24 DIAGNOSIS — M545 Low back pain: Secondary | ICD-10-CM | POA: Diagnosis not present

## 2018-04-24 DIAGNOSIS — I82409 Acute embolism and thrombosis of unspecified deep veins of unspecified lower extremity: Secondary | ICD-10-CM | POA: Diagnosis not present

## 2018-04-24 DIAGNOSIS — K210 Gastro-esophageal reflux disease with esophagitis: Secondary | ICD-10-CM | POA: Diagnosis not present

## 2018-04-24 DIAGNOSIS — J449 Chronic obstructive pulmonary disease, unspecified: Secondary | ICD-10-CM | POA: Diagnosis not present

## 2018-07-08 DIAGNOSIS — F410 Panic disorder [episodic paroxysmal anxiety] without agoraphobia: Secondary | ICD-10-CM | POA: Diagnosis not present

## 2018-07-18 DIAGNOSIS — J449 Chronic obstructive pulmonary disease, unspecified: Secondary | ICD-10-CM | POA: Diagnosis not present

## 2018-07-18 DIAGNOSIS — I82409 Acute embolism and thrombosis of unspecified deep veins of unspecified lower extremity: Secondary | ICD-10-CM | POA: Diagnosis not present

## 2018-07-18 DIAGNOSIS — R5383 Other fatigue: Secondary | ICD-10-CM | POA: Diagnosis not present

## 2018-07-18 DIAGNOSIS — M545 Low back pain: Secondary | ICD-10-CM | POA: Diagnosis not present

## 2018-07-18 DIAGNOSIS — K210 Gastro-esophageal reflux disease with esophagitis: Secondary | ICD-10-CM | POA: Diagnosis not present

## 2018-07-23 DIAGNOSIS — E559 Vitamin D deficiency, unspecified: Secondary | ICD-10-CM | POA: Diagnosis not present

## 2018-07-23 DIAGNOSIS — R5383 Other fatigue: Secondary | ICD-10-CM | POA: Diagnosis not present

## 2018-07-23 DIAGNOSIS — J449 Chronic obstructive pulmonary disease, unspecified: Secondary | ICD-10-CM | POA: Diagnosis not present

## 2018-08-14 ENCOUNTER — Other Ambulatory Visit: Payer: Self-pay

## 2018-09-01 DIAGNOSIS — R531 Weakness: Secondary | ICD-10-CM | POA: Diagnosis not present

## 2018-09-01 DIAGNOSIS — R001 Bradycardia, unspecified: Secondary | ICD-10-CM | POA: Diagnosis not present

## 2018-09-01 DIAGNOSIS — Z87891 Personal history of nicotine dependence: Secondary | ICD-10-CM | POA: Diagnosis not present

## 2018-09-01 DIAGNOSIS — Z7901 Long term (current) use of anticoagulants: Secondary | ICD-10-CM | POA: Diagnosis not present

## 2018-09-01 DIAGNOSIS — J449 Chronic obstructive pulmonary disease, unspecified: Secondary | ICD-10-CM | POA: Diagnosis not present

## 2018-09-01 DIAGNOSIS — Z79899 Other long term (current) drug therapy: Secondary | ICD-10-CM | POA: Diagnosis not present

## 2018-09-01 DIAGNOSIS — Z885 Allergy status to narcotic agent status: Secondary | ICD-10-CM | POA: Diagnosis not present

## 2018-09-01 DIAGNOSIS — R51 Headache: Secondary | ICD-10-CM | POA: Diagnosis not present

## 2018-09-01 DIAGNOSIS — F419 Anxiety disorder, unspecified: Secondary | ICD-10-CM | POA: Diagnosis not present

## 2018-09-01 DIAGNOSIS — F329 Major depressive disorder, single episode, unspecified: Secondary | ICD-10-CM | POA: Diagnosis not present

## 2018-09-01 DIAGNOSIS — R5381 Other malaise: Secondary | ICD-10-CM | POA: Diagnosis not present

## 2018-09-04 DIAGNOSIS — R001 Bradycardia, unspecified: Secondary | ICD-10-CM | POA: Diagnosis not present

## 2018-09-10 ENCOUNTER — Emergency Department (HOSPITAL_COMMUNITY): Payer: Medicare Other

## 2018-09-10 ENCOUNTER — Other Ambulatory Visit: Payer: Self-pay

## 2018-09-10 ENCOUNTER — Emergency Department (HOSPITAL_COMMUNITY)
Admission: EM | Admit: 2018-09-10 | Discharge: 2018-09-10 | Disposition: A | Discharge status: Home / Self Care | Payer: Medicare Other | Attending: Emergency Medicine | Admitting: Emergency Medicine

## 2018-09-10 ENCOUNTER — Encounter (HOSPITAL_COMMUNITY): Payer: Self-pay

## 2018-09-10 DIAGNOSIS — R0789 Other chest pain: Secondary | ICD-10-CM | POA: Insufficient documentation

## 2018-09-10 DIAGNOSIS — Z79899 Other long term (current) drug therapy: Secondary | ICD-10-CM | POA: Insufficient documentation

## 2018-09-10 DIAGNOSIS — R079 Chest pain, unspecified: Secondary | ICD-10-CM | POA: Diagnosis not present

## 2018-09-10 DIAGNOSIS — Z87891 Personal history of nicotine dependence: Secondary | ICD-10-CM | POA: Insufficient documentation

## 2018-09-10 DIAGNOSIS — Z7982 Long term (current) use of aspirin: Secondary | ICD-10-CM | POA: Diagnosis not present

## 2018-09-10 LAB — CBC
HCT: 42.8 % (ref 36.0–46.0)
Hemoglobin: 13.2 g/dL (ref 12.0–15.0)
MCH: 27.9 pg (ref 26.0–34.0)
MCHC: 30.8 g/dL (ref 30.0–36.0)
MCV: 90.5 fL (ref 80.0–100.0)
Platelets: 210 10*3/uL (ref 150–400)
RBC: 4.73 MIL/uL (ref 3.87–5.11)
RDW: 13.2 % (ref 11.5–15.5)
WBC: 4.7 10*3/uL (ref 4.0–10.5)
nRBC: 0 % (ref 0.0–0.2)

## 2018-09-10 LAB — BASIC METABOLIC PANEL
Anion gap: 8 (ref 5–15)
BUN: 19 mg/dL (ref 8–23)
CO2: 27 mmol/L (ref 22–32)
Calcium: 9.3 mg/dL (ref 8.9–10.3)
Chloride: 102 mmol/L (ref 98–111)
Creatinine, Ser: 0.85 mg/dL (ref 0.44–1.00)
GFR calc Af Amer: >60 mL/min (ref >60)
GFR calc non Af Amer: >60 mL/min (ref >60)
Glucose, Bld: 87 mg/dL (ref 70–99)
Potassium: 4.1 mmol/L (ref 3.5–5.1)
Sodium: 137 mmol/L (ref 135–145)

## 2018-09-10 LAB — TROPONIN I (HIGH SENSITIVITY)
Troponin I (High Sensitivity): 3 ng/L (ref <18)
Troponin I (High Sensitivity): 4 ng/L (ref <18)

## 2018-09-10 MED ORDER — DICLOFENAC SODIUM 50 MG PO TBEC: 50.0000 mg | DELAYED_RELEASE_TABLET | Freq: Two times a day (BID) | ORAL | 0 refills | Status: DC

## 2018-09-10 MED ORDER — SODIUM CHLORIDE 0.9% FLUSH: 3.0000 mL | Freq: Once | INTRAVENOUS | Status: DC

## 2018-09-10 MED ORDER — KETOROLAC TROMETHAMINE 30 MG/ML IJ SOLN
30.0000 mg | Freq: Once | INTRAMUSCULAR | Status: AC
  Administered 2018-09-10: 30 mg via INTRAVENOUS
  Filled 2018-09-10: qty 1

## 2018-09-10 NOTE — ED Triage Notes (Signed)
Pt presents to ED with complaints of chest pain x 3 weeks. Pt states hurting across center chest, and through back. Pt states she was seen at [facility] recently for the same thing. Pt states she has also had HA.

## 2018-09-10 NOTE — ED Notes (Addendum)
Pt wheeled to waiting room. Pt verbalized understanding of discharge instructions.   

## 2018-09-10 NOTE — Discharge Instructions (Addendum)
Test showed no evidence of a heart attack.  Prescription for anti-inflammatory pain medicine called into your pharmacy.  Follow-up with your primary care doctor.
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
## 2018-09-16 DIAGNOSIS — Z86718 Personal history of other venous thrombosis and embolism: Secondary | ICD-10-CM | POA: Diagnosis not present

## 2018-09-16 DIAGNOSIS — R001 Bradycardia, unspecified: Secondary | ICD-10-CM | POA: Diagnosis not present

## 2018-09-16 DIAGNOSIS — R079 Chest pain, unspecified: Secondary | ICD-10-CM | POA: Diagnosis not present

## 2018-09-16 DIAGNOSIS — R0602 Shortness of breath: Secondary | ICD-10-CM | POA: Diagnosis not present

## 2018-09-17 DIAGNOSIS — R079 Chest pain, unspecified: Secondary | ICD-10-CM | POA: Diagnosis not present

## 2018-09-17 DIAGNOSIS — R001 Bradycardia, unspecified: Secondary | ICD-10-CM | POA: Diagnosis not present

## 2018-10-03 DIAGNOSIS — R001 Bradycardia, unspecified: Secondary | ICD-10-CM | POA: Diagnosis not present

## 2018-10-03 DIAGNOSIS — R0602 Shortness of breath: Secondary | ICD-10-CM | POA: Diagnosis not present

## 2018-10-03 DIAGNOSIS — R5383 Other fatigue: Secondary | ICD-10-CM | POA: Diagnosis not present

## 2018-10-03 DIAGNOSIS — R062 Wheezing: Secondary | ICD-10-CM | POA: Diagnosis not present

## 2018-10-03 DIAGNOSIS — R079 Chest pain, unspecified: Secondary | ICD-10-CM | POA: Diagnosis not present

## 2018-10-04 DIAGNOSIS — Z23 Encounter for immunization: Secondary | ICD-10-CM | POA: Diagnosis not present

## 2018-10-22 DIAGNOSIS — K219 Gastro-esophageal reflux disease without esophagitis: Secondary | ICD-10-CM | POA: Diagnosis not present

## 2018-10-22 DIAGNOSIS — Z0000 Encounter for general adult medical examination without abnormal findings: Secondary | ICD-10-CM | POA: Diagnosis not present

## 2018-10-22 DIAGNOSIS — M545 Low back pain: Secondary | ICD-10-CM | POA: Diagnosis not present

## 2018-10-22 DIAGNOSIS — Z1389 Encounter for screening for other disorder: Secondary | ICD-10-CM | POA: Diagnosis not present

## 2018-10-22 DIAGNOSIS — I82439 Acute embolism and thrombosis of unspecified popliteal vein: Secondary | ICD-10-CM | POA: Diagnosis not present

## 2018-10-22 DIAGNOSIS — J449 Chronic obstructive pulmonary disease, unspecified: Secondary | ICD-10-CM | POA: Diagnosis not present

## 2018-10-24 DIAGNOSIS — R001 Bradycardia, unspecified: Secondary | ICD-10-CM | POA: Diagnosis not present

## 2018-10-24 DIAGNOSIS — R0602 Shortness of breath: Secondary | ICD-10-CM | POA: Diagnosis not present

## 2018-10-24 DIAGNOSIS — R079 Chest pain, unspecified: Secondary | ICD-10-CM | POA: Diagnosis not present

## 2018-11-06 ENCOUNTER — Other Ambulatory Visit: Payer: Self-pay

## 2018-11-06 DIAGNOSIS — Z20822 Contact with and (suspected) exposure to covid-19: Secondary | ICD-10-CM

## 2018-11-07 ENCOUNTER — Emergency Department (HOSPITAL_COMMUNITY)
Admission: EM | Admit: 2018-11-07 | Discharge: 2018-11-07 | Disposition: A | Discharge status: Home / Self Care | Payer: Medicare Other | Attending: Emergency Medicine | Admitting: Emergency Medicine

## 2018-11-07 ENCOUNTER — Emergency Department (HOSPITAL_COMMUNITY): Payer: Medicare Other

## 2018-11-07 ENCOUNTER — Encounter (HOSPITAL_COMMUNITY): Payer: Self-pay

## 2018-11-07 DIAGNOSIS — Z7982 Long term (current) use of aspirin: Secondary | ICD-10-CM | POA: Diagnosis not present

## 2018-11-07 DIAGNOSIS — M25519 Pain in unspecified shoulder: Secondary | ICD-10-CM | POA: Diagnosis not present

## 2018-11-07 DIAGNOSIS — J986 Disorders of diaphragm: Secondary | ICD-10-CM | POA: Diagnosis not present

## 2018-11-07 DIAGNOSIS — Z87891 Personal history of nicotine dependence: Secondary | ICD-10-CM | POA: Diagnosis not present

## 2018-11-07 DIAGNOSIS — R079 Chest pain, unspecified: Secondary | ICD-10-CM | POA: Diagnosis not present

## 2018-11-07 DIAGNOSIS — G8929 Other chronic pain: Secondary | ICD-10-CM

## 2018-11-07 DIAGNOSIS — R072 Precordial pain: Secondary | ICD-10-CM | POA: Diagnosis not present

## 2018-11-07 DIAGNOSIS — R42 Dizziness and giddiness: Secondary | ICD-10-CM | POA: Diagnosis not present

## 2018-11-07 DIAGNOSIS — M25511 Pain in right shoulder: Secondary | ICD-10-CM | POA: Insufficient documentation

## 2018-11-07 DIAGNOSIS — Z79899 Other long term (current) drug therapy: Secondary | ICD-10-CM | POA: Diagnosis not present

## 2018-11-07 DIAGNOSIS — R0789 Other chest pain: Secondary | ICD-10-CM | POA: Diagnosis not present

## 2018-11-07 DIAGNOSIS — I10 Essential (primary) hypertension: Secondary | ICD-10-CM | POA: Diagnosis not present

## 2018-11-07 LAB — BASIC METABOLIC PANEL
Anion gap: 8 (ref 5–15)
BUN: 14 mg/dL (ref 8–23)
CO2: 25 mmol/L (ref 22–32)
Calcium: 9.6 mg/dL (ref 8.9–10.3)
Chloride: 104 mmol/L (ref 98–111)
Creatinine, Ser: 0.7 mg/dL (ref 0.44–1.00)
GFR calc Af Amer: >60 mL/min (ref >60)
GFR calc non Af Amer: >60 mL/min (ref >60)
Glucose, Bld: 99 mg/dL (ref 70–99)
Potassium: 3.5 mmol/L (ref 3.5–5.1)
Sodium: 137 mmol/L (ref 135–145)

## 2018-11-07 LAB — CBC
HCT: 43.2 % (ref 36.0–46.0)
Hemoglobin: 13.5 g/dL (ref 12.0–15.0)
MCH: 28.2 pg (ref 26.0–34.0)
MCHC: 31.3 g/dL (ref 30.0–36.0)
MCV: 90.2 fL (ref 80.0–100.0)
Platelets: 252 10*3/uL (ref 150–400)
RBC: 4.79 MIL/uL (ref 3.87–5.11)
RDW: 13 % (ref 11.5–15.5)
WBC: 5.5 10*3/uL (ref 4.0–10.5)
nRBC: 0 % (ref 0.0–0.2)

## 2018-11-07 LAB — TROPONIN I (HIGH SENSITIVITY)
Troponin I (High Sensitivity): 4 ng/L (ref <18)
Troponin I (High Sensitivity): 4 ng/L (ref <18)

## 2018-11-07 MED ORDER — SODIUM CHLORIDE 0.9% FLUSH
3.0000 mL | Freq: Once | INTRAVENOUS | Status: AC
  Administered 2018-11-07: 19:00:00 3 mL via INTRAVENOUS

## 2018-11-07 NOTE — ED Triage Notes (Signed)
Pt here for chest pain. Was seen for same previously. Has followed up with cardiology and cannot seem to find a definitive cause of chest pain. NAD. EKG with EMS unremarkable.
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
## 2018-11-07 NOTE — Discharge Instructions (Addendum)
Work-up for the chest pain without any acute findings.  Do recommend that you follow back up with your cardiologist.  Also follow-up with your regular doctor.  Chest x-ray was negative.  Referral provided for the right shoulder pain with Dr. Aline Brochure.  Since she just received narcotic prescription on October 9 that was intended to last 30 days not able to provide any other pain medicine.

## 2018-11-09 LAB — NOVEL CORONAVIRUS, NAA: SARS-CoV-2, NAA: NOT DETECTED

## 2018-11-12 ENCOUNTER — Telehealth: Payer: Self-pay | Admitting: General Practice

## 2018-11-12 NOTE — Telephone Encounter (Signed)
Negative COVID results given. Patient results "NOT Detected." Caller expressed understanding. ° °

## 2018-11-14 DIAGNOSIS — J449 Chronic obstructive pulmonary disease, unspecified: Secondary | ICD-10-CM | POA: Diagnosis not present

## 2018-11-14 DIAGNOSIS — F329 Major depressive disorder, single episode, unspecified: Secondary | ICD-10-CM | POA: Diagnosis not present

## 2018-11-14 DIAGNOSIS — Z87891 Personal history of nicotine dependence: Secondary | ICD-10-CM | POA: Diagnosis not present

## 2018-11-14 DIAGNOSIS — R110 Nausea: Secondary | ICD-10-CM | POA: Diagnosis not present

## 2018-11-14 DIAGNOSIS — Z7901 Long term (current) use of anticoagulants: Secondary | ICD-10-CM | POA: Diagnosis not present

## 2018-11-14 DIAGNOSIS — Z885 Allergy status to narcotic agent status: Secondary | ICD-10-CM | POA: Diagnosis not present

## 2018-11-14 DIAGNOSIS — K219 Gastro-esophageal reflux disease without esophagitis: Secondary | ICD-10-CM | POA: Diagnosis not present

## 2018-11-14 DIAGNOSIS — Z79899 Other long term (current) drug therapy: Secondary | ICD-10-CM | POA: Diagnosis not present

## 2018-11-14 DIAGNOSIS — F419 Anxiety disorder, unspecified: Secondary | ICD-10-CM | POA: Diagnosis not present

## 2018-11-14 DIAGNOSIS — R0602 Shortness of breath: Secondary | ICD-10-CM | POA: Diagnosis not present

## 2018-11-28 ENCOUNTER — Ambulatory Visit (INDEPENDENT_AMBULATORY_CARE_PROVIDER_SITE_OTHER): Payer: Medicare Other | Admitting: Orthopaedic Surgery

## 2018-11-28 ENCOUNTER — Ambulatory Visit: Payer: Medicare Other | Admitting: Gastroenterology

## 2018-11-28 ENCOUNTER — Encounter: Payer: Self-pay | Admitting: Orthopaedic Surgery

## 2018-11-28 ENCOUNTER — Ambulatory Visit (INDEPENDENT_AMBULATORY_CARE_PROVIDER_SITE_OTHER): Payer: Medicare Other

## 2018-11-28 VITALS — BP 103/62 | HR 72 | Ht 72.0 in | Wt 173.0 lb

## 2018-11-28 DIAGNOSIS — M25511 Pain in right shoulder: Secondary | ICD-10-CM

## 2018-11-28 DIAGNOSIS — G8929 Other chronic pain: Secondary | ICD-10-CM

## 2018-11-28 DIAGNOSIS — R5383 Other fatigue: Secondary | ICD-10-CM | POA: Diagnosis not present
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
## 2018-12-20 ENCOUNTER — Encounter: Payer: Self-pay | Admitting: Gastroenterology

## 2018-12-20 ENCOUNTER — Other Ambulatory Visit: Payer: Self-pay

## 2018-12-20 ENCOUNTER — Ambulatory Visit (INDEPENDENT_AMBULATORY_CARE_PROVIDER_SITE_OTHER): Payer: Medicare Other | Admitting: Gastroenterology

## 2018-12-20 VITALS — BP 119/52 | HR 77 | Temp 96.2°F | Ht 72.0 in | Wt 170.4 lb

## 2018-12-20 DIAGNOSIS — K625 Hemorrhage of anus and rectum: Secondary | ICD-10-CM | POA: Diagnosis not present

## 2018-12-20 DIAGNOSIS — K2270 Barrett's esophagus without dysplasia: Secondary | ICD-10-CM | POA: Diagnosis not present

## 2018-12-20 DIAGNOSIS — R1319 Other dysphagia: Secondary | ICD-10-CM

## 2018-12-20 DIAGNOSIS — K5900 Constipation, unspecified: Secondary | ICD-10-CM

## 2018-12-20 NOTE — Patient Instructions (Signed)
You will need an upper endoscopy with dilation. We need to find out about stopping Eliquis first.  For constipation: start taking Linzess 1 capsule, 30 minutes before breakfast. You may have some loose stool starting out, but it should get better in 4-5 days. If not, please call. Please let us know how this works for you so we can titrate it as needed.  Further recommendations to follow!  It was a pleasure to see you today. I want to create trusting relationships with patients to provide genuine, compassionate, and quality care. I value your feedback. If you receive a survey regarding your visit,  I greatly appreciate you taking time to fill this out.   Annitta Needs, PhD, ANP-BC [facility]

## 2018-12-20 NOTE — Progress Notes (Signed)
Referring Provider: Neale Burly, MD Primary Care Physician:  Neale Burly, MD Primary GI: Dr. Gala Romney   Chief Complaint  Patient presents with  . Abdominal Pain    mid lower abd  . Dysphagia    hurts in chest, worse when eating; food gets stuck  . Rectal Bleeding    HPI:   Jessica Gibbs is an 78 y.o. female presenting today with a history of short segment Barrett's and overdue for surveillance, distant history of adenomas with last colonoscopy 2013 without polyps, chronic abdominal pain, IBS with diarrhea predominant symptoms, chronic GERD. She was last seen in Aug 2013.   Over 4 months lost 18 lbs. States she can't swallow. Notes odynophagia. Solid food dysphagia. Tolerating liquids. No appetite. Early satiety. Dysphagia onset about 2.5 weeks ago. Has no energy. Feels it's hard to stand for long periods of time. Prilosec BID. Chest is sore all the time, worsened with eating.   Lower abdominal pain. Rectal bleeding X2 with straining. Sometimes constipation. BM about twice a week. Takes an OTC laxative here and there. Chronic narcotics for back pain.   PCP prescribed Eliquis for DVT around time of summer 2019.      Past Medical History:  Diagnosis Date  . Anxiety   . Barrett's esophagus   . Chronic back pain   . Depression   . DVT (deep venous thrombosis) ([place])    Summer 2019, PCP prescribed Eliquis  . GERD (gastroesophageal reflux disease)   . Helicobacter pylori gastritis 2008  . Numbness     Past Surgical History:  Procedure Laterality Date  . ABDOMINAL HYSTERECTOMY  1981  . APPENDECTOMY  1977  . BACK SURGERY  1997  . CATARACT EXTRACTION W/PHACO Right 02/11/2015   Procedure: CATARACT EXTRACTION PHACO AND INTRAOCULAR LENS PLACEMENT RIGHT EYE;  Surgeon: Tonny Branch, MD;  Location: [facility];  Service: Ophthalmology;  Laterality: Right;  CDE 9.62  . CATARACT EXTRACTION W/PHACO Left 03/01/2015   Procedure: CATARACT EXTRACTION PHACO AND INTRAOCULAR LENS PLACEMENT  (IOC);  Surgeon: Tonny Branch, MD;  Location: [facility];  Service: Ophthalmology;  Laterality: Left;  CDE 10.17  . CHOLECYSTECTOMY  1987  . COLONOSCOPY  06/21/06   normal  . COLONOSCOPY  11/25/01   internal hemorrhoids/otherwise normal  . COLONOSCOPY  11/24/97   mild colitis/hyperplastic polyps  . COLONOSCOPY  09/19/2011   random biopsies benign, colonic diverticulosis  . ESOPHAGOGASTRODUODENOSCOPY  07/28/2009   small HH/2 cm of tongue of salmom colored/? short Barrett's s/p dilationesophageal web, PATH: bening mildly inflamed GE junction mucosa c/.w reflux. negative Barrett's   . ESOPHAGOGASTRODUODENOSCOPY  06/21/06   small hiatal hernia/tongue salmon colored, PATH: SS  Barrett's. + H.PYLORI GASTRITIS  . ESOPHAGOGASTRODUODENOSCOPY  07/08/07  . VAGINAL HYSTERECTOMY      Current Outpatient Medications  Medication Sig Dispense Refill  . ALPRAZolam (XANAX) 1 MG tablet Take 1 mg by mouth at bedtime as needed. Takes one tablet every night and sometimes takes one in the AM    . aspirin 81 MG tablet Take 81 mg by mouth every morning.     Marland Kitchen buPROPion (WELLBUTRIN XL) 150 MG 24 hr tablet Take 150 mg by mouth every morning.    Golden Hurter 18-103 MCG/ACT inhaler Inhale 1 puff into the lungs 4 (four) times daily as needed. Shortness of Breath    . ELIQUIS 5 MG TABS tablet Take 5 mg by mouth 2 (two) times daily.    Marland Kitchen omeprazole (PRILOSEC) 20 MG capsule Take  20 mg by mouth 2 (two) times daily before a meal.     . oxyCODONE-acetaminophen (PERCOCET/ROXICET) 5-325 MG per tablet Take 0.5-1 tablets by mouth daily as needed for moderate pain or severe pain. *May take up to 3 times daily as needed for pain     No current facility-administered medications for this visit.     Allergies as of 12/20/2018 - Review Complete 12/20/2018  Allergen Reaction Noted  . Morphine and related Shortness Of Breath 10/17/2012    Family History  Problem Relation Age of Onset  . Heart Problems Mother   . Colon cancer Neg Hx      Social History   Socioeconomic History  . Marital status: Divorced    Spouse name: Not on file  . Number of children: 9  . Years of education: 9 th  . Highest education level: Not on file  Occupational History    Employer: RETIRED    Comment: retired  Scientific laboratory technician  . Financial resource strain: Not on file  . Food insecurity    Worry: Not on file    Inability: Not on file  . Transportation needs    Medical: Not on file    Non-medical: Not on file  Tobacco Use  . Smoking status: Former Smoker    Types: Cigarettes    Quit date: 01/20/2004    Years since quitting: 14.9  . Smokeless tobacco: Never Used  . Tobacco comment: Quit 2004  Substance and Sexual Activity  . Alcohol use: No  . Drug use: No  . Sexual activity: Not on file  Lifestyle  . Physical activity    Days per week: Not on file    Minutes per session: Not on file  . Stress: Not on file  Relationships  . Social Herbalist on phone: Not on file    Gets together: Not on file    Attends religious service: Not on file    Active member of club or organization: Not on file    Attends meetings of clubs or organizations: Not on file    Relationship status: Not on file  Other Topics Concern  . Not on file  Social History Narrative   Patient lives at home with her daughter.   Retired.   Education- 9 th grade   Right handed.   Caffeine - half cup of coffee am.    Review of Systems: Gen: Denies fever, chills, anorexia. Denies fatigue, weakness, weight loss.  CV: Denies chest pain, palpitations, syncope, peripheral edema, and claudication. Resp: +DOE GI: Denies vomiting blood, jaundice, and fecal incontinence.   Denies dysphagia or odynophagia. Derm: Denies rash, itching, dry skin Psych: +anxiety Heme: see HPI   Physical Exam: BP (!) 119/52   Pulse 77   Temp (!) 96.2 F (35.7 C) (Temporal)   Ht 6' (1.829 m)   Wt 170 lb 6.4 oz (77.3 kg)   BMI 23.11 kg/m  General:   Alert and oriented. No distress  noted. Pleasant and cooperative.  Head:  Normocephalic and atraumatic. Eyes:  Conjuctiva clear without scleral icterus. Lungs: clear bilaterally Cardiac: S1 S2 present without murmurs  Abdomen:  +BS, soft, non-tender and non-distended. No rebound or guarding. No HSM or masses noted. Msk:  Symmetrical without gross deformities. Normal posture. Extremities:  Without edema. Neurologic:  Alert and  oriented x4 Psych:  Alert and cooperative. Normal mood and affect.  ASSESSMENT: Jessica Gibbs is an 78 y.o. female presenting today with history of  Barrett's and overdue for surveillance, now with new onset dysphagia, odynophagia, early satiety. Continues with Prilosec BID. Will need EGD/dilation in near future; however, she is on Eliquis for history of DVT last year. We will need to discuss with PCP holding this prior.  Constipation: in setting of narcotics. Scant hematochezia with straining. Although due for surveillance with remote history of adenomas, prioritizing EGD due to significant upper GI symptoms.      PLAN:  Proceed with upper endoscopy/dilation in the near future with Dr. Gala Romney with PROPOFOL. The risks, benefits, and alternatives have been discussed in detail with patient. They have stated understanding and desire to proceed.  Hold Xarelto for 48 hours prior Continue Prilosec BID Linzess 290 mcg daily, samples provided Further recommendations to follow   Annitta Needs, PhD, ANP-BC [facility]

## 2018-12-23 ENCOUNTER — Encounter: Payer: Self-pay | Admitting: Gastroenterology

## 2018-12-26 ENCOUNTER — Telehealth: Payer: Self-pay | Admitting: Internal Medicine

## 2018-12-26 NOTE — Telephone Encounter (Signed)
Per last OV need to see if Eliquis can be stopped. AM sent letter 12/24/2018. Called spoke with pt and she is aware.

## 2018-12-26 NOTE — Telephone Encounter (Signed)
Pt had OV on 12/4 and daughter was calling to see if the EGD has been scheduled yet. I told her the scheduler would be calling the patient to set that up. Please call    [phone] or the daughter Hilda Blades) [phone]

## 2018-12-28 ENCOUNTER — Emergency Department (HOSPITAL_COMMUNITY)
Admission: EM | Admit: 2018-12-28 | Discharge: 2018-12-28 | Disposition: A | Discharge status: Home / Self Care | Payer: Medicare Other | Attending: Emergency Medicine | Admitting: Emergency Medicine

## 2018-12-28 ENCOUNTER — Emergency Department (HOSPITAL_COMMUNITY): Payer: Medicare Other

## 2018-12-28 ENCOUNTER — Other Ambulatory Visit: Payer: Self-pay

## 2018-12-28 ENCOUNTER — Encounter (HOSPITAL_COMMUNITY): Payer: Self-pay | Admitting: *Deleted

## 2018-12-28 DIAGNOSIS — Z20828 Contact with and (suspected) exposure to other viral communicable diseases: Secondary | ICD-10-CM | POA: Insufficient documentation

## 2018-12-28 DIAGNOSIS — Z79899 Other long term (current) drug therapy: Secondary | ICD-10-CM | POA: Insufficient documentation

## 2018-12-28 DIAGNOSIS — R0602 Shortness of breath: Secondary | ICD-10-CM | POA: Diagnosis present

## 2018-12-28 DIAGNOSIS — Z87891 Personal history of nicotine dependence: Secondary | ICD-10-CM | POA: Insufficient documentation

## 2018-12-28 DIAGNOSIS — J449 Chronic obstructive pulmonary disease, unspecified: Secondary | ICD-10-CM | POA: Diagnosis not present

## 2018-12-28 DIAGNOSIS — Z7982 Long term (current) use of aspirin: Secondary | ICD-10-CM | POA: Insufficient documentation

## 2018-12-28 DIAGNOSIS — Z7901 Long term (current) use of anticoagulants: Secondary | ICD-10-CM | POA: Insufficient documentation

## 2018-12-28 LAB — COMPREHENSIVE METABOLIC PANEL
ALT: 15 U/L (ref 0–44)
AST: 15 U/L (ref 15–41)
Albumin: 3.6 g/dL (ref 3.5–5.0)
Alkaline Phosphatase: 72 U/L (ref 38–126)
Anion gap: 11 (ref 5–15)
BUN: 18 mg/dL (ref 8–23)
CO2: 25 mmol/L (ref 22–32)
Calcium: 9.9 mg/dL (ref 8.9–10.3)
Chloride: 104 mmol/L (ref 98–111)
Creatinine, Ser: 0.83 mg/dL (ref 0.44–1.00)
GFR calc Af Amer: >60 mL/min (ref >60)
GFR calc non Af Amer: >60 mL/min (ref >60)
Glucose, Bld: 109 mg/dL — ABNORMAL HIGH (ref 70–99)
Potassium: 4.2 mmol/L (ref 3.5–5.1)
Sodium: 140 mmol/L (ref 135–145)
Total Bilirubin: 0.6 mg/dL (ref 0.3–1.2)
Total Protein: 7.4 g/dL (ref 6.5–8.1)

## 2018-12-28 LAB — CBC WITH DIFFERENTIAL/PLATELET
Abs Immature Granulocytes: 0.02 10*3/uL (ref 0.00–0.07)
Basophils Absolute: 0.1 10*3/uL (ref 0.0–0.1)
Basophils Relative: 1 %
Eosinophils Absolute: 0.1 10*3/uL (ref 0.0–0.5)
Eosinophils Relative: 2 %
HCT: 40.1 % (ref 36.0–46.0)
Hemoglobin: 12.5 g/dL (ref 12.0–15.0)
Immature Granulocytes: 0 %
Lymphocytes Relative: 18 %
Lymphs Abs: 1.2 10*3/uL (ref 0.7–4.0)
MCH: 28 pg (ref 26.0–34.0)
MCHC: 31.2 g/dL (ref 30.0–36.0)
MCV: 89.9 fL (ref 80.0–100.0)
Monocytes Absolute: 0.6 10*3/uL (ref 0.1–1.0)
Monocytes Relative: 8 %
Neutro Abs: 4.9 10*3/uL (ref 1.7–7.7)
Neutrophils Relative %: 71 %
Platelets: 274 10*3/uL (ref 150–400)
RBC: 4.46 MIL/uL (ref 3.87–5.11)
RDW: 13 % (ref 11.5–15.5)
WBC: 6.8 10*3/uL (ref 4.0–10.5)
nRBC: 0 % (ref 0.0–0.2)

## 2018-12-28 LAB — TSH: TSH: 0.939 u[IU]/mL (ref 0.350–4.500)

## 2018-12-28 LAB — D-DIMER, QUANTITATIVE: D-Dimer, Quant: 0.39 ug/mL-FEU (ref 0.00–0.50)

## 2018-12-28 LAB — TROPONIN I (HIGH SENSITIVITY)
Troponin I (High Sensitivity): 5 ng/L (ref <18)
Troponin I (High Sensitivity): 4 ng/L (ref <18)

## 2018-12-28 LAB — POC SARS CORONAVIRUS 2 AG -  ED: SARS Coronavirus 2 Ag: NEGATIVE

## 2018-12-28 LAB — BRAIN NATRIURETIC PEPTIDE: B Natriuretic Peptide: 63 pg/mL (ref 0.0–100.0)

## 2018-12-28 MED ORDER — ALBUTEROL SULFATE HFA 108 (90 BASE) MCG/ACT IN AERS: 2.0000 | INHALATION_SPRAY | Freq: Four times a day (QID) | RESPIRATORY_TRACT | 0 refills | Status: AC | PRN
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
## 2018-12-28 NOTE — Discharge Instructions (Signed)
Your testing is reassuring.  Your x-ray is negative.  Your heart enzymes are normal and your test for blood clots is negative.  You should quarantine while your coronavirus test is pending.  Follow-up with your doctor.  Return to the ED if you develop new or worsening symptoms.

## 2018-12-28 NOTE — ED Notes (Signed)
Pt ambulated in room.   o2 sats were 96-100%. Initial hr was 87. Max at 13.  No complaints of sob, dizziness, cp.   States she "has these weak spells every now and again, I feel okay now".   Gait was steady.

## 2018-12-28 NOTE — ED Triage Notes (Signed)
Sob started this morning.  96% RA sats reported 125/64 C/o HA, denies fever

## 2018-12-29 LAB — SARS CORONAVIRUS 2 (TAT 6-24 HRS): SARS Coronavirus 2: NEGATIVE

## 2019-01-06 ENCOUNTER — Telehealth: Payer: Self-pay | Admitting: Internal Medicine

## 2019-01-06 NOTE — Telephone Encounter (Signed)
Patient called asking when her procedure was going to be scheduled    said she is having swallowing difficulty

## 2019-01-06 NOTE — Telephone Encounter (Signed)
Received clearance. Okay to hold eliquis 48 hours prior to upper endoscopy.  Patient states she is not able to eat any solid foods and can only swallow liquids. AB please advise AB thanks

## 2019-01-06 NOTE — Telephone Encounter (Signed)
Yes, please schedule EGD/dilation with Propofol by DR. Rourk ASAP. HOLD ELIQUIS 2 days prior. Stick with liquids, protein shakes. To ED if unable to tolerate liquids.

## 2019-01-06 NOTE — Telephone Encounter (Signed)
Called pt. Aware we are awaiting clearance.

## 2019-01-07 ENCOUNTER — Other Ambulatory Visit: Payer: Self-pay

## 2019-01-07 DIAGNOSIS — K2270 Barrett's esophagus without dysplasia: Secondary | ICD-10-CM

## 2019-01-07 DIAGNOSIS — R1319 Other dysphagia: Secondary | ICD-10-CM

## 2019-01-07 NOTE — Telephone Encounter (Signed)
Spoke to [place] at endo. Soonest procedure can be done is 01/30/19 at 3:30pm.  Called pt, EGD/Dilation w/Propofol w/RMR scheduled for 01/30/19 at 3:30pm. Pt aware to hold Eliquis for 2 days and ED precautions given (verbalized understanding). Orders entered.

## 2019-01-07 NOTE — Telephone Encounter (Signed)
Pre-op and COVID test 01/28/19. Appt letter mailed with procedure instructions.

## 2019-01-21 DIAGNOSIS — F419 Anxiety disorder, unspecified: Secondary | ICD-10-CM | POA: Diagnosis not present

## 2019-01-21 DIAGNOSIS — Z86718 Personal history of other venous thrombosis and embolism: Secondary | ICD-10-CM | POA: Diagnosis not present

## 2019-01-21 DIAGNOSIS — R0600 Dyspnea, unspecified: Secondary | ICD-10-CM | POA: Diagnosis not present

## 2019-01-21 DIAGNOSIS — Z7901 Long term (current) use of anticoagulants: Secondary | ICD-10-CM | POA: Diagnosis not present

## 2019-01-21 DIAGNOSIS — G8929 Other chronic pain: Secondary | ICD-10-CM | POA: Diagnosis not present

## 2019-01-21 DIAGNOSIS — F329 Major depressive disorder, single episode, unspecified: Secondary | ICD-10-CM | POA: Diagnosis not present

## 2019-01-21 DIAGNOSIS — R05 Cough: Secondary | ICD-10-CM | POA: Diagnosis not present

## 2019-01-21 DIAGNOSIS — Z87891 Personal history of nicotine dependence: Secondary | ICD-10-CM | POA: Diagnosis not present

## 2019-01-21 DIAGNOSIS — Z79899 Other long term (current) drug therapy: Secondary | ICD-10-CM | POA: Diagnosis not present

## 2019-01-21 DIAGNOSIS — R0602 Shortness of breath: Secondary | ICD-10-CM | POA: Diagnosis not present

## 2019-01-21 DIAGNOSIS — Z885 Allergy status to narcotic agent status: Secondary | ICD-10-CM | POA: Diagnosis not present

## 2019-01-21 DIAGNOSIS — J441 Chronic obstructive pulmonary disease with (acute) exacerbation: Secondary | ICD-10-CM | POA: Diagnosis not present

## 2019-01-24 DIAGNOSIS — J984 Other disorders of lung: Secondary | ICD-10-CM | POA: Diagnosis not present

## 2019-01-24 DIAGNOSIS — J9859 Other diseases of mediastinum, not elsewhere classified: Secondary | ICD-10-CM | POA: Diagnosis not present

## 2019-01-24 DIAGNOSIS — R531 Weakness: Secondary | ICD-10-CM | POA: Diagnosis not present

## 2019-01-24 DIAGNOSIS — J189 Pneumonia, unspecified organism: Secondary | ICD-10-CM | POA: Diagnosis not present

## 2019-01-24 DIAGNOSIS — R918 Other nonspecific abnormal finding of lung field: Secondary | ICD-10-CM | POA: Diagnosis not present

## 2019-01-24 DIAGNOSIS — R509 Fever, unspecified: Secondary | ICD-10-CM | POA: Diagnosis not present

## 2019-01-24 DIAGNOSIS — Z20822 Contact with and (suspected) exposure to covid-19: Secondary | ICD-10-CM | POA: Diagnosis not present

## 2019-01-24 DIAGNOSIS — F411 Generalized anxiety disorder: Secondary | ICD-10-CM | POA: Diagnosis not present

## 2019-01-24 DIAGNOSIS — J440 Chronic obstructive pulmonary disease with acute lower respiratory infection: Secondary | ICD-10-CM | POA: Diagnosis not present

## 2019-01-24 DIAGNOSIS — G8929 Other chronic pain: Secondary | ICD-10-CM | POA: Diagnosis not present

## 2019-01-24 DIAGNOSIS — R1310 Dysphagia, unspecified: Secondary | ICD-10-CM | POA: Diagnosis not present

## 2019-01-24 DIAGNOSIS — U071 COVID-19: Secondary | ICD-10-CM | POA: Diagnosis not present

## 2019-01-24 DIAGNOSIS — J1282 Pneumonia due to coronavirus disease 2019: Secondary | ICD-10-CM | POA: Diagnosis not present

## 2019-01-24 DIAGNOSIS — R0600 Dyspnea, unspecified: Secondary | ICD-10-CM | POA: Diagnosis not present

## 2019-01-24 DIAGNOSIS — R069 Unspecified abnormalities of breathing: Secondary | ICD-10-CM | POA: Diagnosis not present

## 2019-01-25 DIAGNOSIS — G8929 Other chronic pain: Secondary | ICD-10-CM | POA: Diagnosis present

## 2019-01-25 DIAGNOSIS — J1282 Pneumonia due to coronavirus disease 2019: Secondary | ICD-10-CM | POA: Diagnosis present

## 2019-01-25 DIAGNOSIS — R918 Other nonspecific abnormal finding of lung field: Secondary | ICD-10-CM | POA: Diagnosis not present

## 2019-01-25 DIAGNOSIS — R0600 Dyspnea, unspecified: Secondary | ICD-10-CM | POA: Diagnosis not present

## 2019-01-25 DIAGNOSIS — J1281 Pneumonia due to SARS-associated coronavirus: Secondary | ICD-10-CM | POA: Diagnosis not present

## 2019-01-25 DIAGNOSIS — Z86711 Personal history of pulmonary embolism: Secondary | ICD-10-CM | POA: Diagnosis not present

## 2019-01-25 DIAGNOSIS — Z87891 Personal history of nicotine dependence: Secondary | ICD-10-CM | POA: Diagnosis not present

## 2019-01-25 DIAGNOSIS — Z7901 Long term (current) use of anticoagulants: Secondary | ICD-10-CM | POA: Diagnosis not present

## 2019-01-25 DIAGNOSIS — F411 Generalized anxiety disorder: Secondary | ICD-10-CM | POA: Diagnosis present

## 2019-01-25 DIAGNOSIS — R1310 Dysphagia, unspecified: Secondary | ICD-10-CM | POA: Diagnosis not present

## 2019-01-25 DIAGNOSIS — U071 COVID-19: Secondary | ICD-10-CM | POA: Diagnosis not present

## 2019-01-25 DIAGNOSIS — J984 Other disorders of lung: Secondary | ICD-10-CM | POA: Diagnosis not present

## 2019-01-25 DIAGNOSIS — J440 Chronic obstructive pulmonary disease with acute lower respiratory infection: Secondary | ICD-10-CM | POA: Diagnosis present

## 2019-01-25 DIAGNOSIS — J189 Pneumonia, unspecified organism: Secondary | ICD-10-CM | POA: Diagnosis not present

## 2019-01-27 ENCOUNTER — Telehealth: Payer: Self-pay | Admitting: Internal Medicine

## 2019-01-27 NOTE — Telephone Encounter (Signed)
Patient admitted at [facility] with pneumonia and her procedure and pre-op needs to be cancelled

## 2019-01-27 NOTE — Telephone Encounter (Signed)
Noted. FYI to AB. Endo aware

## 2019-01-28 ENCOUNTER — Encounter (HOSPITAL_COMMUNITY): Admission: RE | Admit: 2019-01-28 | Payer: Medicare Other | Source: Ambulatory Visit

## 2019-01-28 ENCOUNTER — Other Ambulatory Visit (HOSPITAL_COMMUNITY)
Admission: RE | Admit: 2019-01-28 | Discharge: 2019-01-28 | Disposition: A | Discharge status: Home / Self Care | Payer: Medicare Other | Source: Ambulatory Visit | Attending: Internal Medicine | Admitting: Internal Medicine

## 2019-01-30 ENCOUNTER — Encounter (HOSPITAL_COMMUNITY): Payer: Self-pay

## 2019-01-30 ENCOUNTER — Ambulatory Visit (HOSPITAL_COMMUNITY): Admit: 2019-01-30 | Payer: Medicare Other | Admitting: Internal Medicine

## 2019-01-30 SURGERY — ESOPHAGOGASTRODUODENOSCOPY (EGD) WITH PROPOFOL
Anesthesia: Monitor Anesthesia Care

## 2019-02-03 DIAGNOSIS — I82402 Acute embolism and thrombosis of unspecified deep veins of left lower extremity: Secondary | ICD-10-CM | POA: Diagnosis not present

## 2019-02-03 DIAGNOSIS — J13 Pneumonia due to Streptococcus pneumoniae: Secondary | ICD-10-CM | POA: Diagnosis not present

## 2019-02-03 DIAGNOSIS — C3481 Malignant neoplasm of overlapping sites of right bronchus and lung: Secondary | ICD-10-CM | POA: Diagnosis not present

## 2019-02-04 DIAGNOSIS — J439 Emphysema, unspecified: Secondary | ICD-10-CM | POA: Diagnosis not present

## 2019-02-04 DIAGNOSIS — C3481 Malignant neoplasm of overlapping sites of right bronchus and lung: Secondary | ICD-10-CM | POA: Diagnosis not present

## 2019-02-11 DIAGNOSIS — J449 Chronic obstructive pulmonary disease, unspecified: Secondary | ICD-10-CM | POA: Diagnosis not present

## 2019-02-11 DIAGNOSIS — G8929 Other chronic pain: Secondary | ICD-10-CM | POA: Diagnosis not present

## 2019-02-11 DIAGNOSIS — C3490 Malignant neoplasm of unspecified part of unspecified bronchus or lung: Secondary | ICD-10-CM | POA: Diagnosis not present

## 2019-02-11 DIAGNOSIS — I82492 Acute embolism and thrombosis of other specified deep vein of left lower extremity: Secondary | ICD-10-CM | POA: Diagnosis not present

## 2019-02-12 DIAGNOSIS — C3490 Malignant neoplasm of unspecified part of unspecified bronchus or lung: Secondary | ICD-10-CM | POA: Diagnosis not present

## 2019-02-12 DIAGNOSIS — I82492 Acute embolism and thrombosis of other specified deep vein of left lower extremity: Secondary | ICD-10-CM | POA: Diagnosis not present

## 2019-02-12 DIAGNOSIS — G8929 Other chronic pain: Secondary | ICD-10-CM | POA: Diagnosis not present

## 2019-02-12 DIAGNOSIS — J449 Chronic obstructive pulmonary disease, unspecified: Secondary | ICD-10-CM | POA: Diagnosis not present

## 2019-02-13 DIAGNOSIS — C3490 Malignant neoplasm of unspecified part of unspecified bronchus or lung: Secondary | ICD-10-CM | POA: Diagnosis not present

## 2019-02-13 DIAGNOSIS — J449 Chronic obstructive pulmonary disease, unspecified: Secondary | ICD-10-CM | POA: Diagnosis not present

## 2019-02-13 DIAGNOSIS — I82492 Acute embolism and thrombosis of other specified deep vein of left lower extremity: Secondary | ICD-10-CM | POA: Diagnosis not present

## 2019-02-13 DIAGNOSIS — G8929 Other chronic pain: Secondary | ICD-10-CM | POA: Diagnosis not present

## 2019-02-16 DIAGNOSIS — E860 Dehydration: Secondary | ICD-10-CM | POA: Diagnosis not present

## 2019-02-16 DIAGNOSIS — Z87891 Personal history of nicotine dependence: Secondary | ICD-10-CM | POA: Diagnosis not present

## 2019-02-16 DIAGNOSIS — Z7901 Long term (current) use of anticoagulants: Secondary | ICD-10-CM | POA: Diagnosis not present

## 2019-02-16 DIAGNOSIS — K228 Other specified diseases of esophagus: Secondary | ICD-10-CM | POA: Diagnosis not present

## 2019-02-16 DIAGNOSIS — R1310 Dysphagia, unspecified: Secondary | ICD-10-CM | POA: Diagnosis not present

## 2019-02-16 DIAGNOSIS — Z79899 Other long term (current) drug therapy: Secondary | ICD-10-CM | POA: Diagnosis not present

## 2019-02-16 DIAGNOSIS — J449 Chronic obstructive pulmonary disease, unspecified: Secondary | ICD-10-CM | POA: Diagnosis not present

## 2019-02-16 DIAGNOSIS — R222 Localized swelling, mass and lump, trunk: Secondary | ICD-10-CM | POA: Diagnosis not present

## 2019-02-16 DIAGNOSIS — R531 Weakness: Secondary | ICD-10-CM | POA: Diagnosis not present

## 2019-02-16 DIAGNOSIS — K219 Gastro-esophageal reflux disease without esophagitis: Secondary | ICD-10-CM | POA: Diagnosis not present

## 2019-02-17 DIAGNOSIS — I82492 Acute embolism and thrombosis of other specified deep vein of left lower extremity: Secondary | ICD-10-CM | POA: Diagnosis not present

## 2019-02-17 DIAGNOSIS — R0602 Shortness of breath: Secondary | ICD-10-CM | POA: Diagnosis not present

## 2019-02-17 DIAGNOSIS — R52 Pain, unspecified: Secondary | ICD-10-CM | POA: Diagnosis not present

## 2019-02-17 DIAGNOSIS — J449 Chronic obstructive pulmonary disease, unspecified: Secondary | ICD-10-CM | POA: Diagnosis not present

## 2019-02-17 DIAGNOSIS — R1310 Dysphagia, unspecified: Secondary | ICD-10-CM | POA: Diagnosis not present

## 2019-02-17 DIAGNOSIS — R630 Anorexia: Secondary | ICD-10-CM | POA: Diagnosis not present

## 2019-02-17 DIAGNOSIS — R231 Pallor: Secondary | ICD-10-CM | POA: Diagnosis not present

## 2019-02-18 DIAGNOSIS — R1310 Dysphagia, unspecified: Secondary | ICD-10-CM | POA: Diagnosis not present

## 2019-02-18 DIAGNOSIS — R0602 Shortness of breath: Secondary | ICD-10-CM | POA: Diagnosis not present

## 2019-02-18 DIAGNOSIS — J449 Chronic obstructive pulmonary disease, unspecified: Secondary | ICD-10-CM | POA: Diagnosis not present

## 2019-02-18 DIAGNOSIS — I82492 Acute embolism and thrombosis of other specified deep vein of left lower extremity: Secondary | ICD-10-CM | POA: Diagnosis not present

## 2019-02-18 DIAGNOSIS — R52 Pain, unspecified: Secondary | ICD-10-CM | POA: Diagnosis not present

## 2019-02-18 DIAGNOSIS — R630 Anorexia: Secondary | ICD-10-CM | POA: Diagnosis not present

## 2019-02-19 DIAGNOSIS — I82492 Acute embolism and thrombosis of other specified deep vein of left lower extremity: Secondary | ICD-10-CM | POA: Diagnosis not present

## 2019-02-19 DIAGNOSIS — R630 Anorexia: Secondary | ICD-10-CM | POA: Diagnosis not present

## 2019-02-19 DIAGNOSIS — R0602 Shortness of breath: Secondary | ICD-10-CM | POA: Diagnosis not present

## 2019-02-19 DIAGNOSIS — R52 Pain, unspecified: Secondary | ICD-10-CM | POA: Diagnosis not present

## 2019-02-19 DIAGNOSIS — R1310 Dysphagia, unspecified: Secondary | ICD-10-CM | POA: Diagnosis not present

## 2019-02-19 DIAGNOSIS — J449 Chronic obstructive pulmonary disease, unspecified: Secondary | ICD-10-CM | POA: Diagnosis not present

## 2019-02-21 DIAGNOSIS — I82492 Acute embolism and thrombosis of other specified deep vein of left lower extremity: Secondary | ICD-10-CM | POA: Diagnosis not present

## 2019-02-21 DIAGNOSIS — R630 Anorexia: Secondary | ICD-10-CM | POA: Diagnosis not present

## 2019-02-21 DIAGNOSIS — R1310 Dysphagia, unspecified: Secondary | ICD-10-CM | POA: Diagnosis not present

## 2019-02-21 DIAGNOSIS — J449 Chronic obstructive pulmonary disease, unspecified: Secondary | ICD-10-CM | POA: Diagnosis not present

## 2019-02-21 DIAGNOSIS — R52 Pain, unspecified: Secondary | ICD-10-CM | POA: Diagnosis not present

## 2019-02-21 DIAGNOSIS — R0602 Shortness of breath: Secondary | ICD-10-CM | POA: Diagnosis not present

## 2019-02-25 DIAGNOSIS — R1310 Dysphagia, unspecified: Secondary | ICD-10-CM | POA: Diagnosis not present

## 2019-02-25 DIAGNOSIS — I82492 Acute embolism and thrombosis of other specified deep vein of left lower extremity: Secondary | ICD-10-CM | POA: Diagnosis not present

## 2019-02-25 DIAGNOSIS — R52 Pain, unspecified: Secondary | ICD-10-CM | POA: Diagnosis not present

## 2019-02-25 DIAGNOSIS — R630 Anorexia: Secondary | ICD-10-CM | POA: Diagnosis not present

## 2019-02-25 DIAGNOSIS — J449 Chronic obstructive pulmonary disease, unspecified: Secondary | ICD-10-CM | POA: Diagnosis not present

## 2019-02-25 DIAGNOSIS — R0602 Shortness of breath: Secondary | ICD-10-CM | POA: Diagnosis not present

## 2019-02-26 DIAGNOSIS — R0602 Shortness of breath: Secondary | ICD-10-CM | POA: Diagnosis not present

## 2019-02-26 DIAGNOSIS — R1310 Dysphagia, unspecified: Secondary | ICD-10-CM | POA: Diagnosis not present

## 2019-02-26 DIAGNOSIS — R52 Pain, unspecified: Secondary | ICD-10-CM | POA: Diagnosis not present

## 2019-02-26 DIAGNOSIS — I82492 Acute embolism and thrombosis of other specified deep vein of left lower extremity: Secondary | ICD-10-CM | POA: Diagnosis not present

## 2019-02-26 DIAGNOSIS — R630 Anorexia: Secondary | ICD-10-CM | POA: Diagnosis not present

## 2019-02-26 DIAGNOSIS — J449 Chronic obstructive pulmonary disease, unspecified: Secondary | ICD-10-CM | POA: Diagnosis not present

## 2019-02-27 DIAGNOSIS — I82492 Acute embolism and thrombosis of other specified deep vein of left lower extremity: Secondary | ICD-10-CM | POA: Diagnosis not present

## 2019-02-27 DIAGNOSIS — R630 Anorexia: Secondary | ICD-10-CM | POA: Diagnosis not present

## 2019-02-27 DIAGNOSIS — R0602 Shortness of breath: Secondary | ICD-10-CM | POA: Diagnosis not present

## 2019-02-27 DIAGNOSIS — R1310 Dysphagia, unspecified: Secondary | ICD-10-CM | POA: Diagnosis not present

## 2019-02-27 DIAGNOSIS — J449 Chronic obstructive pulmonary disease, unspecified: Secondary | ICD-10-CM | POA: Diagnosis not present

## 2019-02-27 DIAGNOSIS — R52 Pain, unspecified: Secondary | ICD-10-CM | POA: Diagnosis not present

## 2019-03-17 DEATH — deceased

## 2020-08-03 IMAGING — DX CHEST - 2 VIEW
2 series · 2 of 2 positions shown · non-contrast
Comparison: Radiograph June 27, 2016

CLINICAL DATA: Chest pain for 3 weeks, central chest and through
the back

EXAM:
CHEST - 2 VIEW

[chest pa]
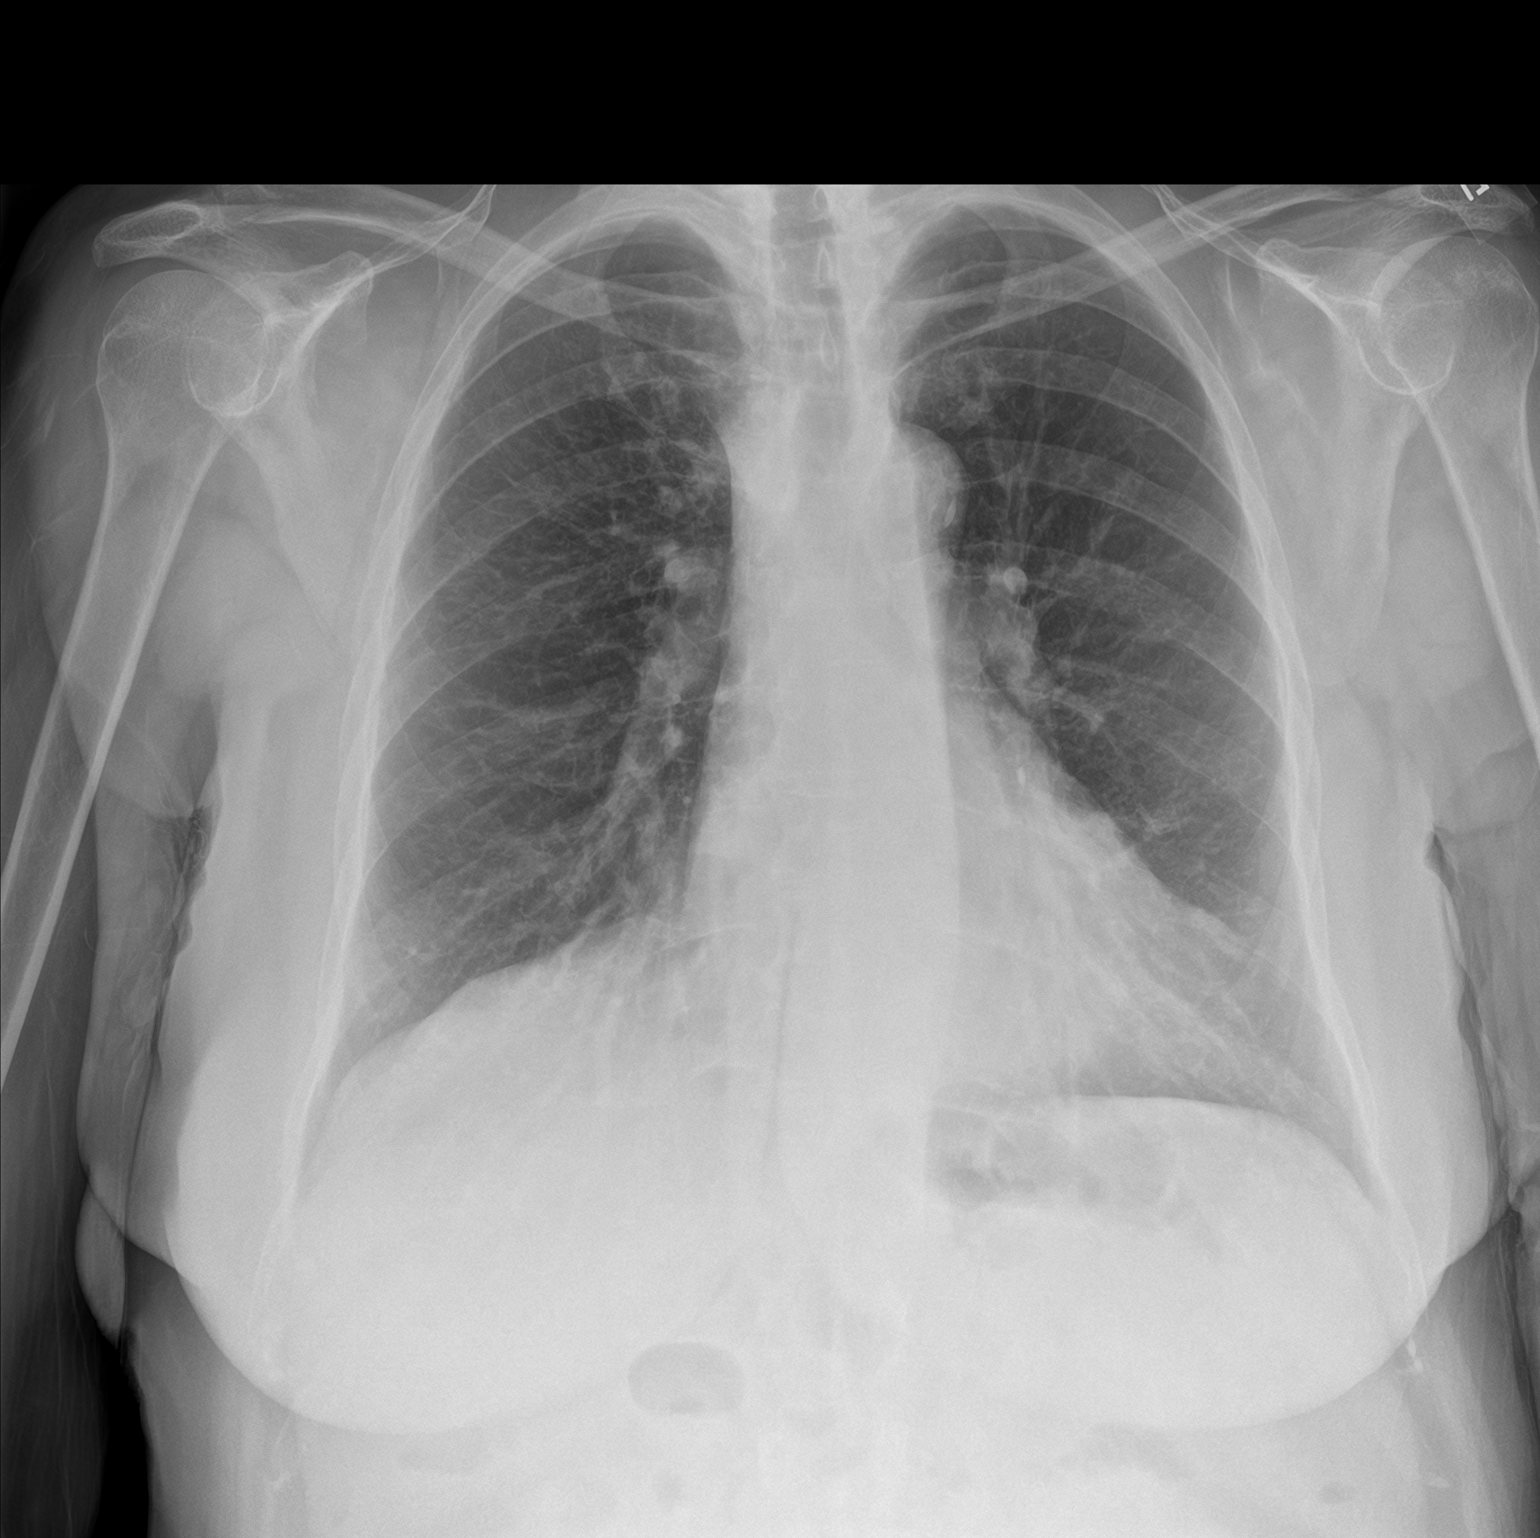

[chest lat]
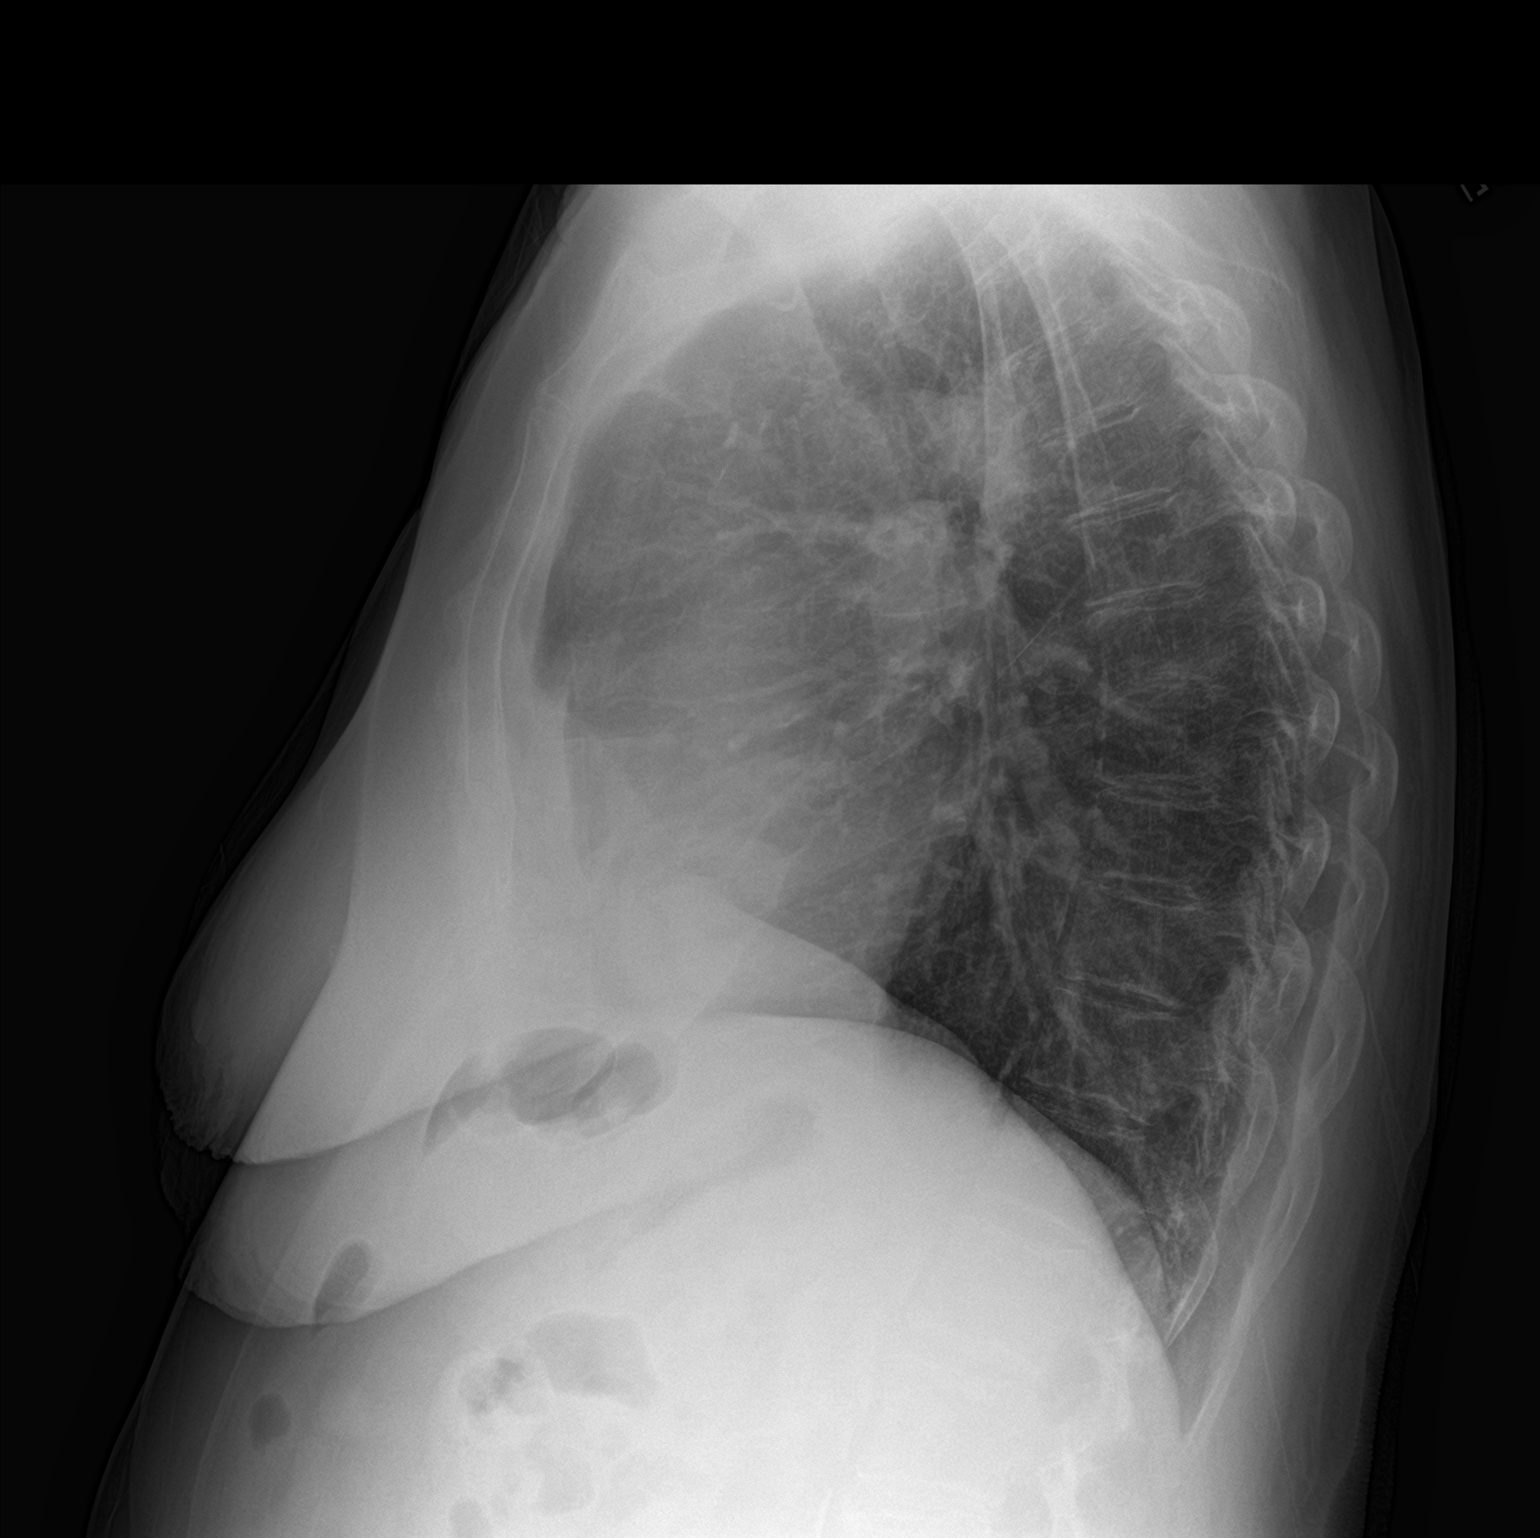

[2 of 2 positions shown; findings below may reference images not displayed]

FINDINGS: Chronically coarsened interstitial markings. No focal consolidative
process. No pneumothorax or effusion. Cardiomediastinal contours are
unchanged from priors. No acute osseous or soft tissue abnormality.
IMPRESSION: 1. No acute cardiopulmonary abnormality.
2. Chronically coarsened interstitial markings.

## 2020-11-20 IMAGING — DX DG CHEST 1V PORT
1 series · 1 of 1 positions shown · non-contrast
Comparison: 11/14/2010

CLINICAL DATA: Pt states she has been sob and c/o a HA onset since
this morning but denies fever, cough, or N/V. Pt denies any on body
medical injector devices.

EXAM:
PORTABLE CHEST 1 VIEW

[chest ap]
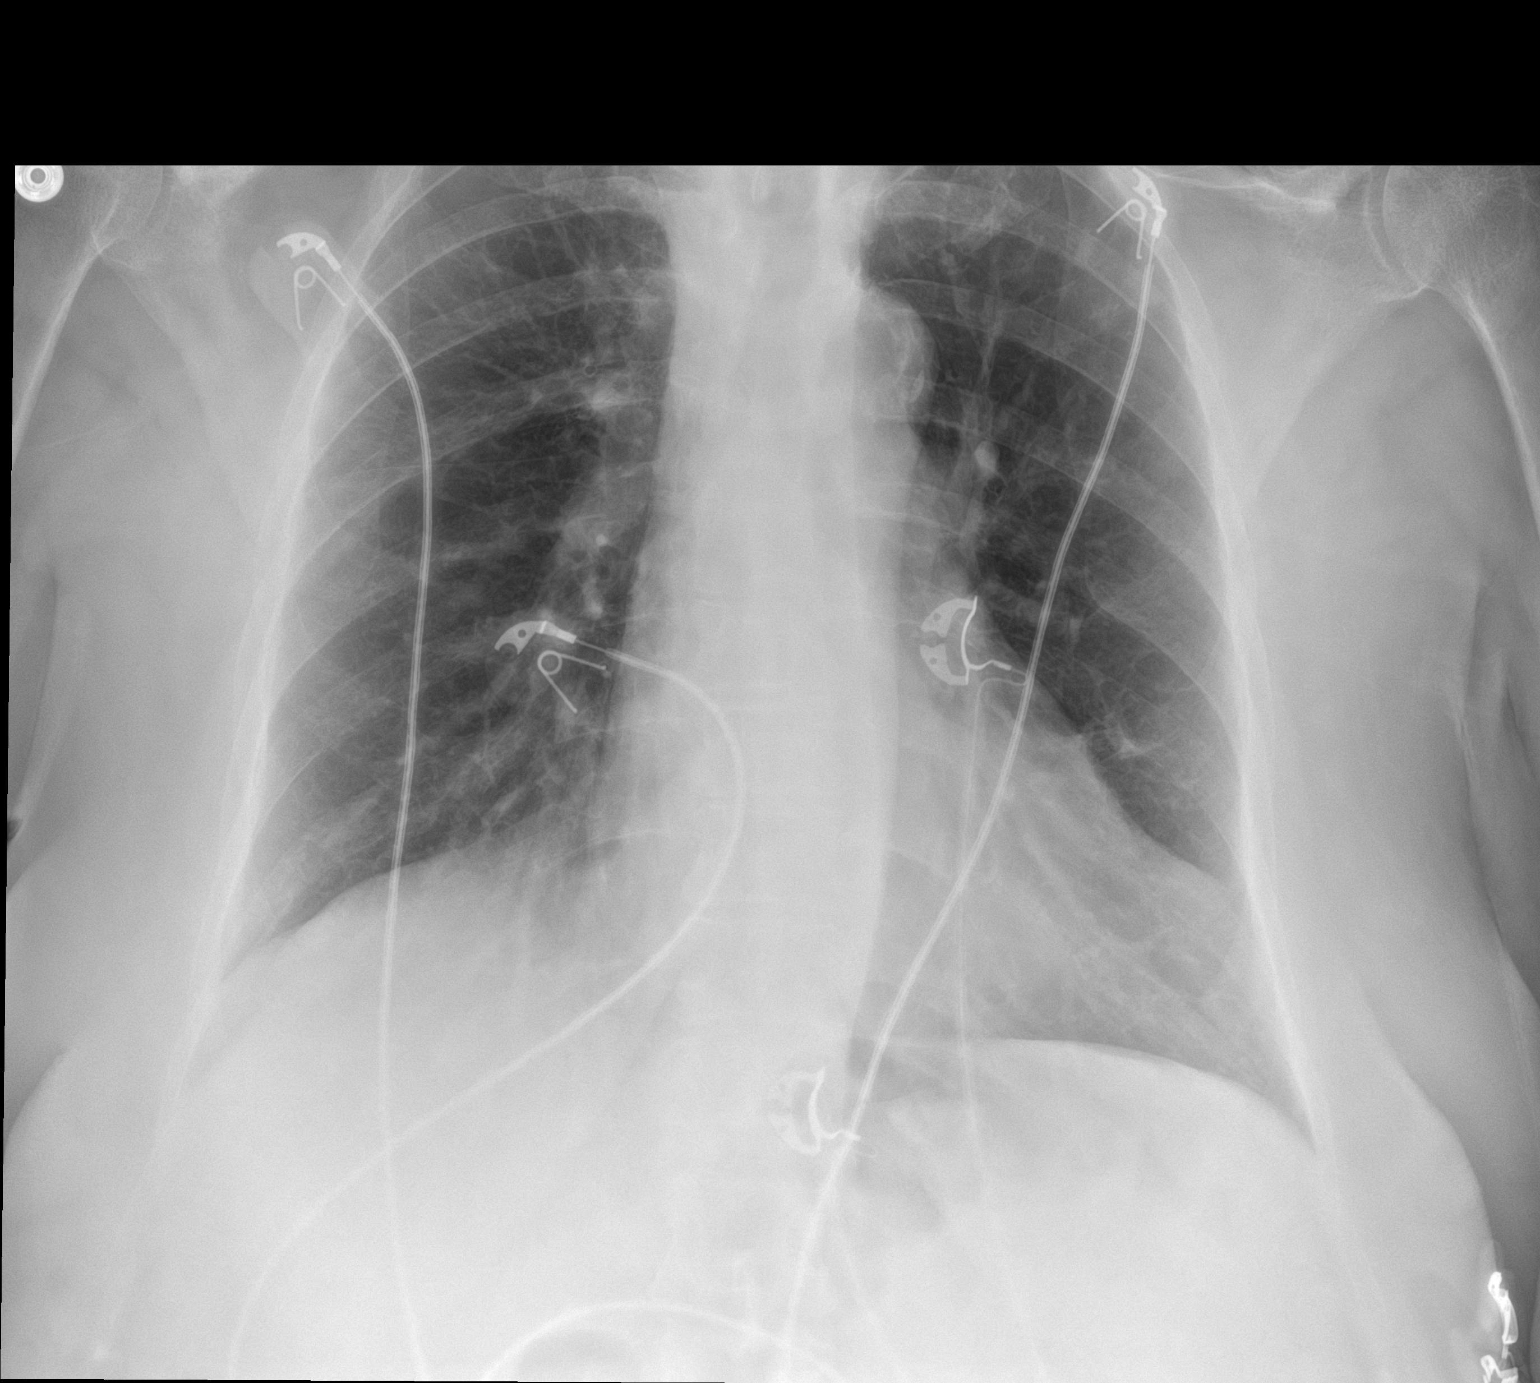

[1 of 1 positions shown; findings below may reference images not displayed]

FINDINGS: Cardiac silhouette is normal in size. No mediastinal or hilar
masses. No evidence of adenopathy.

Minor areas of lung scarring. Lungs otherwise clear. No pleural
effusion or pneumothorax.

Skeletal structures are grossly intact.
IMPRESSION: No active disease.
